# Patient Record
Sex: Female | Born: 1985 | Race: White | Hispanic: No | Marital: Single | Smoking: Never smoker
Health system: Southern US, Community
[De-identification: ages and names within clinical notes are randomized; demographics above are authoritative.]

## PROBLEM LIST (undated history)

## (undated) DIAGNOSIS — R51 Headache: Secondary | ICD-10-CM

## (undated) DIAGNOSIS — F419 Anxiety disorder, unspecified: Secondary | ICD-10-CM

## (undated) DIAGNOSIS — G4730 Sleep apnea, unspecified: Secondary | ICD-10-CM

## (undated) DIAGNOSIS — R519 Headache, unspecified: Secondary | ICD-10-CM

## (undated) DIAGNOSIS — F32A Depression, unspecified: Secondary | ICD-10-CM

## (undated) DIAGNOSIS — L0390 Cellulitis, unspecified: Secondary | ICD-10-CM

## (undated) DIAGNOSIS — E039 Hypothyroidism, unspecified: Secondary | ICD-10-CM

## (undated) HISTORY — PX: MYRINGOTOMY WITH TUBE PLACEMENT: SHX5663

## (undated) HISTORY — PX: UTERINE FIBROID SURGERY: SHX826

## (undated) HISTORY — PX: TONSILLECTOMY: SUR1361

## (undated) HISTORY — PX: OTHER SURGICAL HISTORY: SHX169

## (undated) HISTORY — DX: Headache: R51

## (undated) HISTORY — DX: Headache, unspecified: R51.9

---

## 2006-03-01 ENCOUNTER — Emergency Department (HOSPITAL_COMMUNITY): Admission: EM | Admit: 2006-03-01 | Discharge: 2006-03-01 | Payer: Self-pay | Admitting: Emergency Medicine

## 2009-06-07 ENCOUNTER — Other Ambulatory Visit: Admission: RE | Admit: 2009-06-07 | Discharge: 2009-06-07 | Payer: Self-pay | Admitting: Family Medicine

## 2010-07-29 ENCOUNTER — Other Ambulatory Visit
Admission: RE | Admit: 2010-07-29 | Discharge: 2010-07-29 | Payer: Self-pay | Source: Home / Self Care | Admitting: Family Medicine

## 2011-03-24 ENCOUNTER — Other Ambulatory Visit: Payer: Self-pay | Admitting: Family Medicine

## 2011-03-24 ENCOUNTER — Ambulatory Visit
Admission: RE | Admit: 2011-03-24 | Discharge: 2011-03-24 | Disposition: A | Discharge status: Home / Self Care | Payer: BC Managed Care – PPO | Source: Ambulatory Visit | Attending: Family Medicine | Admitting: Family Medicine

## 2011-03-24 DIAGNOSIS — M25572 Pain in left ankle and joints of left foot: Secondary | ICD-10-CM

## 2012-10-03 ENCOUNTER — Other Ambulatory Visit: Payer: Self-pay | Admitting: Family Medicine

## 2012-10-03 ENCOUNTER — Other Ambulatory Visit (HOSPITAL_COMMUNITY)
Admission: RE | Admit: 2012-10-03 | Discharge: 2012-10-03 | Disposition: A | Discharge status: Home / Self Care | Payer: BC Managed Care – PPO | Source: Ambulatory Visit | Attending: Family Medicine | Admitting: Family Medicine

## 2012-10-03 DIAGNOSIS — Z124 Encounter for screening for malignant neoplasm of cervix: Secondary | ICD-10-CM | POA: Insufficient documentation

## 2014-04-04 ENCOUNTER — Other Ambulatory Visit (HOSPITAL_COMMUNITY): Payer: Self-pay | Admitting: Family Medicine

## 2014-04-04 DIAGNOSIS — F502 Bulimia nervosa: Secondary | ICD-10-CM

## 2014-04-09 ENCOUNTER — Encounter (HOSPITAL_COMMUNITY)
Admission: RE | Admit: 2014-04-09 | Discharge: 2014-04-09 | Disposition: A | Discharge status: Home / Self Care | Payer: BC Managed Care – PPO | Source: Ambulatory Visit | Attending: Family Medicine | Admitting: Family Medicine

## 2014-04-09 ENCOUNTER — Observation Stay (HOSPITAL_COMMUNITY)
Admission: EM | Admit: 2014-04-09 | Discharge: 2014-04-12 | Disposition: A | Discharge status: Home / Self Care | Payer: BC Managed Care – PPO | Attending: General Surgery | Admitting: General Surgery

## 2014-04-09 ENCOUNTER — Encounter (HOSPITAL_COMMUNITY): Payer: Self-pay

## 2014-04-09 ENCOUNTER — Encounter (HOSPITAL_COMMUNITY): Payer: Self-pay | Admitting: Emergency Medicine

## 2014-04-09 DIAGNOSIS — K828 Other specified diseases of gallbladder: Secondary | ICD-10-CM | POA: Diagnosis present

## 2014-04-09 DIAGNOSIS — R1110 Vomiting, unspecified: Secondary | ICD-10-CM | POA: Diagnosis present

## 2014-04-09 DIAGNOSIS — D259 Leiomyoma of uterus, unspecified: Secondary | ICD-10-CM | POA: Diagnosis present

## 2014-04-09 DIAGNOSIS — Z6841 Body Mass Index (BMI) 40.0 and over, adult: Secondary | ICD-10-CM | POA: Diagnosis not present

## 2014-04-09 DIAGNOSIS — F502 Bulimia nervosa: Secondary | ICD-10-CM

## 2014-04-09 DIAGNOSIS — R109 Unspecified abdominal pain: Secondary | ICD-10-CM | POA: Diagnosis present

## 2014-04-09 DIAGNOSIS — K811 Chronic cholecystitis: Secondary | ICD-10-CM | POA: Diagnosis not present

## 2014-04-09 DIAGNOSIS — Z9049 Acquired absence of other specified parts of digestive tract: Secondary | ICD-10-CM

## 2014-04-09 LAB — COMPREHENSIVE METABOLIC PANEL
ALT: 156 U/L — ABNORMAL HIGH (ref 0–35)
AST: 55 U/L — ABNORMAL HIGH (ref 0–37)
Albumin: 3.8 g/dL (ref 3.5–5.2)
Alkaline Phosphatase: 62 U/L (ref 39–117)
Anion gap: 16 — ABNORMAL HIGH (ref 5–15)
BUN: 7 mg/dL (ref 6–23)
CO2: 26 mEq/L (ref 19–32)
Calcium: 9.7 mg/dL (ref 8.4–10.5)
Chloride: 96 mEq/L (ref 96–112)
Creatinine, Ser: 0.74 mg/dL (ref 0.50–1.10)
GFR calc Af Amer: >90 mL/min (ref >90)
GFR calc non Af Amer: >90 mL/min (ref >90)
Glucose, Bld: 89 mg/dL (ref 70–99)
Potassium: 3.3 mEq/L — ABNORMAL LOW (ref 3.7–5.3)
Sodium: 138 mEq/L (ref 137–147)
Total Bilirubin: 1 mg/dL (ref 0.3–1.2)
Total Protein: 8 g/dL (ref 6.0–8.3)

## 2014-04-09 LAB — URINE MICROSCOPIC-ADD ON

## 2014-04-09 LAB — CBC WITH DIFFERENTIAL/PLATELET
Basophils Absolute: 0 10*3/uL (ref 0.0–0.1)
Basophils Relative: 1 % (ref 0–1)
Eosinophils Absolute: 0.1 10*3/uL (ref 0.0–0.7)
Eosinophils Relative: 1 % (ref 0–5)
HCT: 41.9 % (ref 36.0–46.0)
Hemoglobin: 13.7 g/dL (ref 12.0–15.0)
Lymphocytes Relative: 31 % (ref 12–46)
Lymphs Abs: 2.3 10*3/uL (ref 0.7–4.0)
MCH: 28.2 pg (ref 26.0–34.0)
MCHC: 32.7 g/dL (ref 30.0–36.0)
MCV: 86.2 fL (ref 78.0–100.0)
Monocytes Absolute: 0.7 10*3/uL (ref 0.1–1.0)
Monocytes Relative: 10 % (ref 3–12)
Neutro Abs: 4.2 10*3/uL (ref 1.7–7.7)
Neutrophils Relative %: 57 % (ref 43–77)
Platelets: 233 10*3/uL (ref 150–400)
RBC: 4.86 MIL/uL (ref 3.87–5.11)
RDW: 13.6 % (ref 11.5–15.5)
WBC: 7.4 10*3/uL (ref 4.0–10.5)

## 2014-04-09 LAB — URINALYSIS, ROUTINE W REFLEX MICROSCOPIC
Glucose, UA: NEGATIVE mg/dL
Ketones, ur: 15 mg/dL — AB
Nitrite: POSITIVE — AB
Protein, ur: 30 mg/dL — AB
Specific Gravity, Urine: 1.027 (ref 1.005–1.030)
Urobilinogen, UA: 1 mg/dL (ref 0.0–1.0)
pH: 5.5 (ref 5.0–8.0)

## 2014-04-09 LAB — PREGNANCY, URINE: Preg Test, Ur: NEGATIVE

## 2014-04-09 LAB — LIPASE, BLOOD: Lipase: 24 U/L (ref 11–59)

## 2014-04-09 MED ORDER — TECHNETIUM TC 99M MEBROFENIN IV KIT
4.8000 | PACK | Freq: Once | INTRAVENOUS | Status: AC | PRN
  Administered 2014-04-09: 5 via INTRAVENOUS

## 2014-04-09 MED ORDER — SINCALIDE 5 MCG IJ SOLR: 0.0200 ug/kg | Freq: Once | INTRAMUSCULAR | Status: DC

## 2014-04-09 NOTE — ED Notes (Signed)
Pt reports RUQ pain with n/v.  Pt reports having a test done today, was found to have a non-functioning gallbladder.  Was told she needs surgery done but it's not scheduled yet.

## 2014-04-10 ENCOUNTER — Encounter (HOSPITAL_COMMUNITY): Payer: Self-pay | Admitting: General Surgery

## 2014-04-10 ENCOUNTER — Emergency Department (HOSPITAL_COMMUNITY): Payer: BC Managed Care – PPO

## 2014-04-10 DIAGNOSIS — R109 Unspecified abdominal pain: Secondary | ICD-10-CM | POA: Diagnosis present

## 2014-04-10 DIAGNOSIS — K828 Other specified diseases of gallbladder: Secondary | ICD-10-CM | POA: Diagnosis present

## 2014-04-10 DIAGNOSIS — D259 Leiomyoma of uterus, unspecified: Secondary | ICD-10-CM | POA: Diagnosis present

## 2014-04-10 DIAGNOSIS — K811 Chronic cholecystitis: Secondary | ICD-10-CM | POA: Diagnosis not present

## 2014-04-10 DIAGNOSIS — Z6841 Body Mass Index (BMI) 40.0 and over, adult: Secondary | ICD-10-CM | POA: Diagnosis not present

## 2014-04-10 LAB — COMPREHENSIVE METABOLIC PANEL
ALT: 135 U/L — ABNORMAL HIGH (ref 0–35)
AST: 48 U/L — ABNORMAL HIGH (ref 0–37)
Albumin: 3.5 g/dL (ref 3.5–5.2)
Alkaline Phosphatase: 57 U/L (ref 39–117)
Anion gap: 16 — ABNORMAL HIGH (ref 5–15)
BUN: 7 mg/dL (ref 6–23)
CO2: 25 mEq/L (ref 19–32)
Calcium: 9.1 mg/dL (ref 8.4–10.5)
Chloride: 102 mEq/L (ref 96–112)
Creatinine, Ser: 0.67 mg/dL (ref 0.50–1.10)
GFR calc Af Amer: >90 mL/min (ref >90)
GFR calc non Af Amer: >90 mL/min (ref >90)
Glucose, Bld: 85 mg/dL (ref 70–99)
Potassium: 3.7 mEq/L (ref 3.7–5.3)
Sodium: 143 mEq/L (ref 137–147)
Total Bilirubin: 1 mg/dL (ref 0.3–1.2)
Total Protein: 7.5 g/dL (ref 6.0–8.3)

## 2014-04-10 LAB — URINALYSIS, ROUTINE W REFLEX MICROSCOPIC
Glucose, UA: NEGATIVE mg/dL
Ketones, ur: 15 mg/dL — AB
Nitrite: POSITIVE — AB
Protein, ur: NEGATIVE mg/dL
Specific Gravity, Urine: 1.027 (ref 1.005–1.030)
Urobilinogen, UA: 1 mg/dL (ref 0.0–1.0)
pH: 5.5 (ref 5.0–8.0)

## 2014-04-10 LAB — CBC WITH DIFFERENTIAL/PLATELET
Basophils Absolute: 0 10*3/uL (ref 0.0–0.1)
Basophils Relative: 0 % (ref 0–1)
Eosinophils Absolute: 0.2 10*3/uL (ref 0.0–0.7)
Eosinophils Relative: 3 % (ref 0–5)
HCT: 42.1 % (ref 36.0–46.0)
Hemoglobin: 14.4 g/dL (ref 12.0–15.0)
Lymphocytes Relative: 38 % (ref 12–46)
Lymphs Abs: 2.1 10*3/uL (ref 0.7–4.0)
MCH: 30.3 pg (ref 26.0–34.0)
MCHC: 34.2 g/dL (ref 30.0–36.0)
MCV: 88.4 fL (ref 78.0–100.0)
Monocytes Absolute: 0.6 10*3/uL (ref 0.1–1.0)
Monocytes Relative: 11 % (ref 3–12)
Neutro Abs: 2.6 10*3/uL (ref 1.7–7.7)
Neutrophils Relative %: 48 % (ref 43–77)
Platelets: 184 10*3/uL (ref 150–400)
RBC: 4.76 MIL/uL (ref 3.87–5.11)
RDW: 13.6 % (ref 11.5–15.5)
WBC: 5.5 10*3/uL (ref 4.0–10.5)

## 2014-04-10 LAB — URINE MICROSCOPIC-ADD ON

## 2014-04-10 LAB — LIPASE, BLOOD: Lipase: 24 U/L (ref 11–59)

## 2014-04-10 LAB — SURGICAL PCR SCREEN
MRSA, PCR: NEGATIVE
Staphylococcus aureus: POSITIVE — AB

## 2014-04-10 MED ORDER — DEXTROSE 5 % IV SOLN
1.0000 g | Freq: Once | INTRAVENOUS | Status: AC
  Administered 2014-04-10: 1 g via INTRAVENOUS
  Filled 2014-04-10: qty 10

## 2014-04-10 MED ORDER — DIPHENHYDRAMINE HCL 12.5 MG/5ML PO ELIX: 12.5000 mg | ORAL_SOLUTION | Freq: Four times a day (QID) | ORAL | Status: DC | PRN

## 2014-04-10 MED ORDER — ONDANSETRON HCL 4 MG/2ML IJ SOLN
4.0000 mg | Freq: Once | INTRAMUSCULAR | Status: AC
  Administered 2014-04-10: 4 mg via INTRAVENOUS
  Filled 2014-04-10: qty 2

## 2014-04-10 MED ORDER — PANTOPRAZOLE SODIUM 40 MG PO TBEC
40.0000 mg | DELAYED_RELEASE_TABLET | Freq: Every day | ORAL | Status: DC
  Administered 2014-04-10 – 2014-04-12 (×2): 40 mg via ORAL
  Filled 2014-04-10 (×3): qty 1

## 2014-04-10 MED ORDER — DIPHENHYDRAMINE HCL 50 MG/ML IJ SOLN
12.5000 mg | Freq: Four times a day (QID) | INTRAMUSCULAR | Status: DC | PRN
  Administered 2014-04-12: 12.5 mg via INTRAVENOUS
  Filled 2014-04-10: qty 1

## 2014-04-10 MED ORDER — ACETAMINOPHEN 325 MG PO TABS: 650.0000 mg | ORAL_TABLET | Freq: Four times a day (QID) | ORAL | Status: DC | PRN

## 2014-04-10 MED ORDER — ACETAMINOPHEN 650 MG RE SUPP: 650.0000 mg | Freq: Four times a day (QID) | RECTAL | Status: DC | PRN

## 2014-04-10 MED ORDER — HYDROMORPHONE HCL PF 1 MG/ML IJ SOLN
1.0000 mg | Freq: Once | INTRAMUSCULAR | Status: AC
  Administered 2014-04-10: 1 mg via INTRAVENOUS
  Filled 2014-04-10: qty 1

## 2014-04-10 MED ORDER — CEFAZOLIN SODIUM-DEXTROSE 2-3 GM-% IV SOLR
2.0000 g | Freq: Three times a day (TID) | INTRAVENOUS | Status: DC
  Administered 2014-04-10 – 2014-04-12 (×6): 2 g via INTRAVENOUS
  Filled 2014-04-10 (×7): qty 50

## 2014-04-10 MED ORDER — ONDANSETRON HCL 4 MG/2ML IJ SOLN: 4.0000 mg | Freq: Four times a day (QID) | INTRAMUSCULAR | Status: DC | PRN

## 2014-04-10 MED ORDER — SODIUM CHLORIDE 0.9 % IV SOLN
Freq: Once | INTRAVENOUS | Status: AC
  Administered 2014-04-10: 05:00:00 via INTRAVENOUS

## 2014-04-10 MED ORDER — KCL IN DEXTROSE-NACL 20-5-0.45 MEQ/L-%-% IV SOLN
INTRAVENOUS | Status: DC
  Administered 2014-04-10: 22:00:00 via INTRAVENOUS
  Administered 2014-04-10: 1000 mL via INTRAVENOUS
  Administered 2014-04-11: 06:00:00 via INTRAVENOUS
  Filled 2014-04-10 (×4): qty 1000

## 2014-04-10 MED ORDER — SODIUM CHLORIDE 0.9 % IV BOLUS (SEPSIS)
1000.0000 mL | Freq: Once | INTRAVENOUS | Status: AC
  Administered 2014-04-10: 1000 mL via INTRAVENOUS

## 2014-04-10 MED ORDER — MORPHINE SULFATE 2 MG/ML IJ SOLN
1.0000 mg | INTRAMUSCULAR | Status: DC | PRN
  Administered 2014-04-10 (×2): 2 mg via INTRAVENOUS
  Filled 2014-04-10 (×2): qty 1

## 2014-04-10 NOTE — H&P (Signed)
Monique Duncan [birth year]  [id number] Chief Complaint/Reason for Consult: abdominal pain HPI: This is a 28 yo morbidly obese white female with a h/o abdominal pain felt to be from her uterine fibroids.  Usually this pain is lower in the abdomen, but on her right side.  About 3 weeks ago she began having RUQ abdominal pain with nausea and vomiting.  She would vomit after everything she ate.  She was able to tolerate some liquids, but nothing solid.  She was seen in the [place] ED 2 different times about 2 weeks ago.  She had a CT of her chest as well as her abdomen.  These were normal except findings of two uterine leiomyomas.  She was also diagnosed with a UTI and sent out on Keflex.  She followed up with PCP.  She was found to have some abnormal elevation of her AST and ALT.  She was later sent to a GI doctor who ordered a HIDA scan yesterday.  This revealed a gallbladder ejection fraction of 11%.  Her pain continued to remain so bad that she presented to [facility] last night for further evaluation.  She did have an abdominal ultrasound done that revealed sludge but no stones.  No evidence of cholecystitis.  Her CBD was slightly enlarged, but no definite evidence of choledocholithiasis.  Her AST and ALT were 56 and 156 yesterday with a normal WBC and normal neutrophil count.  She continues to have a UTI.  She denies any difficultly with voiding.  At times she does admit to some SOB and mild chest discomfort.  We have been asked to see the patient for further evaluation.  ROS: Please see HPI, otherwise all other systems have been reviewed and are negative  No family history on file.  History reviewed. No pertinent past medical history.  Past Surgical History  Procedure Laterality Date  . Tonsillectomy    myringotomy with tube placement  Social History:  reports that she has never smoked. She does not have any smokeless tobacco history on file. She reports that she does not drink alcohol or use illicit  drugs.  Allergies:  Allergies  Allergen Reactions  . Bee Venom Swelling     (Not in a hospital admission)  Blood pressure 134/86, pulse 71, temperature 98.2 F (36.8 C), temperature source Oral, resp. rate 16, height $RemoveBe'5\' 2"'EwfiAyvKk$  (1.575 m), weight 296 lb (134.265 kg), last menstrual period 04/05/2014, SpO2 97.00%. Physical Exam: General: pleasant, morbidly obese white female who is laying in bed in NAD HEENT: head is normocephalic, atraumatic.  Sclera are noninjected.  PERRL.  Ears and nose without any masses or lesions.  Mouth is pink and moist Heart: regular, rate, and rhythm.  Normal s1,s2. No obvious murmurs, gallops, or rubs noted.  Palpable radial and pedal pulses bilaterally Lungs: CTAB, no wheezes, rhonchi, or rales noted.  Respiratory effort nonlabored Abd: soft, very tender in RUQ and some tenderness in the RLQ, ND, +BS, no masses, hernias, or organomegaly MS: all 4 extremities are symmetrical with no cyanosis, clubbing, or edema. Skin: warm and dry with no masses, lesions, or rashes Psych: A&Ox3 with an appropriate affect.    Results for orders placed during the hospital encounter of 04/09/14 (from the past 48 hour(s))  PREGNANCY, URINE     Status: None   Collection Time    04/09/14  8:28 PM      Result Value Ref Range   Preg Test, Ur NEGATIVE  NEGATIVE   Comment:  THE SENSITIVITY OF THIS     METHODOLOGY IS >20 mIU/mL.  URINALYSIS, ROUTINE W REFLEX MICROSCOPIC     Status: Abnormal   Collection Time    04/09/14  8:28 PM      Result Value Ref Range   Color, Urine ORANGE (*) YELLOW   Comment: BIOCHEMICALS MAY BE AFFECTED BY COLOR   APPearance TURBID (*) CLEAR   Specific Gravity, Urine 1.027  1.005 - 1.030   pH 5.5  5.0 - 8.0   Glucose, UA NEGATIVE  NEGATIVE mg/dL   Hgb urine dipstick LARGE (*) NEGATIVE   Bilirubin Urine MODERATE (*) NEGATIVE   Ketones, ur 15 (*) NEGATIVE mg/dL   Protein, ur 30 (*) NEGATIVE mg/dL   Urobilinogen, UA 1.0  0.0 - 1.0 mg/dL    Nitrite POSITIVE (*) NEGATIVE   Leukocytes, UA SMALL (*) NEGATIVE  URINE MICROSCOPIC-ADD ON     Status: Abnormal   Collection Time    04/09/14  8:28 PM      Result Value Ref Range   Squamous Epithelial / LPF FEW (*) RARE   WBC, UA 3-6  <3 WBC/hpf   RBC / HPF 7-10  <3 RBC/hpf   Bacteria, UA MANY (*) RARE   Urine-Other AMORPHOUS URATES/PHOSPHATES    CBC WITH DIFFERENTIAL     Status: None   Collection Time    04/09/14  9:03 PM      Result Value Ref Range   WBC 7.4  4.0 - 10.5 K/uL   RBC 4.86  3.87 - 5.11 MIL/uL   Hemoglobin 13.7  12.0 - 15.0 g/dL   HCT 41.9  36.0 - 46.0 %   MCV 86.2  78.0 - 100.0 fL   MCH 28.2  26.0 - 34.0 pg   MCHC 32.7  30.0 - 36.0 g/dL   RDW 13.6  11.5 - 15.5 %   Platelets 233  150 - 400 K/uL   Neutrophils Relative % 57  43 - 77 %   Neutro Abs 4.2  1.7 - 7.7 K/uL   Lymphocytes Relative 31  12 - 46 %   Lymphs Abs 2.3  0.7 - 4.0 K/uL   Monocytes Relative 10  3 - 12 %   Monocytes Absolute 0.7  0.1 - 1.0 K/uL   Eosinophils Relative 1  0 - 5 %   Eosinophils Absolute 0.1  0.0 - 0.7 K/uL   Basophils Relative 1  0 - 1 %   Basophils Absolute 0.0  0.0 - 0.1 K/uL  COMPREHENSIVE METABOLIC PANEL     Status: Abnormal   Collection Time    04/09/14  9:03 PM      Result Value Ref Range   Sodium 138  137 - 147 mEq/L   Potassium 3.3 (*) 3.7 - 5.3 mEq/L   Chloride 96  96 - 112 mEq/L   CO2 26  19 - 32 mEq/L   Glucose, Bld 89  70 - 99 mg/dL   BUN 7  6 - 23 mg/dL   Creatinine, Ser 0.74  0.50 - 1.10 mg/dL   Calcium 9.7  8.4 - 10.5 mg/dL   Total Protein 8.0  6.0 - 8.3 g/dL   Albumin 3.8  3.5 - 5.2 g/dL   AST 55 (*) 0 - 37 U/L   ALT 156 (*) 0 - 35 U/L   Alkaline Phosphatase 62  39 - 117 U/L   Total Bilirubin 1.0  0.3 - 1.2 mg/dL   GFR calc non Af Amer >90  >90  mL/min   GFR calc Af Amer >90  >90 mL/min   Comment: (NOTE)     The eGFR has been calculated using the CKD EPI equation.     This calculation has not been validated in all clinical situations.     eGFR's  persistently <90 mL/min signify possible Chronic Kidney     Disease.   Anion gap 16 (*) 5 - 15  LIPASE, BLOOD     Status: None   Collection Time    04/09/14  9:03 PM      Result Value Ref Range   Lipase 24  11 - 59 U/L   Nm Hepato W/eject Fract  04/09/2014   CLINICAL DATA:  Abdominal pain, vomiting  EXAM: NUCLEAR MEDICINE HEPATOBILIARY IMAGING WITH GALLBLADDER EF  TECHNIQUE: Sequential images of the abdomen were obtained out to 60 minutes following intravenous administration of radiopharmaceutical. After slow intravenous infusion of 2.0 micrograms Cholecystokinin, gallbladder ejection fraction was determined.  RADIOPHARMACEUTICALS:  5.0 Millicurie EL-38B Choletec  COMPARISON:  None  FINDINGS: Normal uptake of radiotracer in the liver. Excreted bile is evident within the small bowel by 20 min. The gallbladder begins to fill at 65 min. Slow infusion of cholecystokinin resulted in minimal contraction of the gallbladder with an estimated ejection fraction = 11%. At 30 min, normal ejection fraction is greater than 30%.  IMPRESSION: 1. Low gallbladder ejection fraction = 11%. Common differential diagnosis includes biliary dyskinesia, chronic cholecystitis, and sphincter Oddi dysfunction. 2. Patent cystic duct and common bile duct.   Electronically Signed   By: Suzy Bouchard M.D.   On: 04/09/2014 11:18   US Abdomen Limited Ruq  04/10/2014   CLINICAL DATA:  RIGHT upper quadrant pain.  EXAM: US ABDOMEN LIMITED - RIGHT UPPER QUADRANT  COMPARISON:  HIDA scan April 09, 2014  FINDINGS: Gallbladder:  Layering echogenic gallbladder sludge. No gallbladder wall thickening or pericholecystic fluid. No sonographic Murphy's sign was elicited.  Common bile duct:  Diameter: 7 mm, enlarged without sonographic findings of choledocholithiasis.  Liver:  No focal lesion identified. Within normal limits in parenchymal echogenicity. Hepatopetal portal vein.  IMPRESSION: Layering gallbladder sludge, without sonographic  findings of acute cholecystitis.  Prominent Common bile duct could reflect recently passed sludge/ stone without sonographic findings of choledocholithiasis.   Electronically Signed   By: Elon Alas   On: 04/10/2014 04:11       Assessment/Plan 1. Abdominal pain 2. Biliary dyskinesia 3. Uterine fibroids 4. Morbid obesity 5. Suspect UTI  Plan: 1. We will get the patient admitted and plan for cholecystectomy this admission.  We will repeat labs today as well as in the am.  She will be started on IVFs as well as ancef.  I have resent her UA and added a culture to this.  abx coverage can be changed when these results return if needed. 2. Remain NPO today in case we can proceed with surgical intervention, but will likely be tomorrow.  I discussed this with the patient as well as her mother.  They understand.    Tim Wilhide E 04/10/2014, 7:55 AM Pager: [phone]

## 2014-04-10 NOTE — ED Notes (Signed)
MD at bedside. 

## 2014-04-10 NOTE — H&P (Signed)
I have seen and examined the pt and agree with PA-Osborne's progress note. Pt with likely biliary dyskenesia vs, cholecystitis Will plan for Lap chole All risks and benefits were discussed with the patient to generally include: infection, bleeding, possible need for post op ERCP, damage to the bile ducts, and bile leak. Alternatives were offered and described.  All questions were answered and the patient voiced understanding of the procedure and wishes to proceed at this point with a laparoscopic cholecystectomy

## 2014-04-10 NOTE — ED Notes (Signed)
US at bedside

## 2014-04-10 NOTE — ED Notes (Signed)
[place] finished and left the room

## 2014-04-10 NOTE — ED Provider Notes (Signed)
CSN: [id number]     Arrival date & time 04/09/14  1911 History   First MD Initiated Contact with Patient 04/09/14 2356     Chief Complaint  Patient presents with  . Abdominal Pain     (Consider location/radiation/quality/duration/timing/severity/associated sxs/prior Treatment) HPI Comments: Pt comes in with cc of abd pain. Abd pain is located in the RUQ and right flank region. Pain x 3 weeks, constant, with waxing and waning intensity. The pain today has been severe, with nausea and emesis. No UTI like sx and denies anorexia. No hx of renal stones and there is no vaginal discharge or bleeding. Pt had RUQ [place] - which was neg, and had HIDA scan that did show biliary dyskinesia. Pt has no f/u at this time. Pain is unbearable, so she comes to the ER. No pain meds taken.   Patient is a 28 y.o. female presenting with abdominal pain. The history is provided by the patient.  Abdominal Pain Associated symptoms: nausea and vomiting   Associated symptoms: no chest pain, no chills, no constipation, no dysuria, no fever and no shortness of breath     History reviewed. No pertinent past medical history. Past Surgical History  Procedure Laterality Date  . Tonsillectomy     No family history on file. History  Substance Use Topics  . Smoking status: Never Smoker   . Smokeless tobacco: Not on file  . Alcohol Use: No   OB History   Grav Para Term Preterm Abortions TAB SAB Ect Mult Living                 Review of Systems  Constitutional: Positive for activity change. Negative for fever and chills.  Respiratory: Negative for shortness of breath.   Cardiovascular: Negative for chest pain.  Gastrointestinal: Positive for nausea, vomiting and abdominal pain. Negative for constipation.  Endocrine: Negative for polyuria.  Genitourinary: Positive for flank pain. Negative for dysuria.  Musculoskeletal: Negative for neck pain.  Skin: Negative for rash.  Allergic/Immunologic: Negative for  immunocompromised state.  Neurological: Negative for headaches.      Allergies  Bee venom  Home Medications   Prior to Admission medications   Medication Sig Start Date End Date Taking? Authorizing Provider  Norgestimate-Ethinyl Estradiol Triphasic (TRI-SPRINTEC) 0.18/0.215/0.25 MG-35 MCG tablet Take 1 tablet by mouth daily.   Yes Historical Provider, MD   BP 132/61  Pulse 58  Temp(Src) 98.4 F (36.9 C) (Oral)  Resp 16  Ht 5\' 2"  (1.575 m)  Wt 296 lb (134.265 kg)  BMI 54.13 kg/m2  SpO2 100%  LMP 04/05/2014 Physical Exam  Nursing note and vitals reviewed. Constitutional: She is oriented to person, place, and time. She appears well-developed and well-nourished.  HENT:  Head: Normocephalic and atraumatic.  Eyes: EOM are normal. Pupils are equal, round, and reactive to light.  Neck: Neck supple.  Cardiovascular: Normal rate, regular rhythm and normal heart sounds.   No murmur heard. Pulmonary/Chest: Effort normal. No respiratory distress.  Abdominal: Soft. She exhibits no distension. There is tenderness. There is guarding. There is no rebound.  RUQ tenderness, with Murphy's  Neurological: She is alert and oriented to person, place, and time.  Skin: Skin is warm and dry.    ED Course  Procedures (including critical care time) Labs Review Labs Reviewed  COMPREHENSIVE METABOLIC PANEL - Abnormal; Notable for the following:    Potassium 3.3 (*)    AST 55 (*)    ALT 156 (*)    Anion gap 16 (*)  All other components within normal limits  URINALYSIS, ROUTINE W REFLEX MICROSCOPIC - Abnormal; Notable for the following:    Color, Urine ORANGE (*)    APPearance TURBID (*)    Hgb urine dipstick LARGE (*)    Bilirubin Urine MODERATE (*)    Ketones, ur 15 (*)    Protein, ur 30 (*)    Nitrite POSITIVE (*)    Leukocytes, UA SMALL (*)    All other components within normal limits  URINE MICROSCOPIC-ADD ON - Abnormal; Notable for the following:    Squamous Epithelial / LPF FEW  (*)    Bacteria, UA MANY (*)    All other components within normal limits  URINE CULTURE  CBC WITH DIFFERENTIAL  LIPASE, BLOOD  PREGNANCY, URINE  CBC WITH DIFFERENTIAL  COMPREHENSIVE METABOLIC PANEL  LIPASE, BLOOD  URINALYSIS, ROUTINE W REFLEX MICROSCOPIC    Imaging Review Nm Hepato W/eject Fract  04/09/2014   CLINICAL DATA:  Abdominal pain, vomiting  EXAM: NUCLEAR MEDICINE HEPATOBILIARY IMAGING WITH GALLBLADDER EF  TECHNIQUE: Sequential images of the abdomen were obtained out to 60 minutes following intravenous administration of radiopharmaceutical. After slow intravenous infusion of 2.0 micrograms Cholecystokinin, gallbladder ejection fraction was determined.  RADIOPHARMACEUTICALS:  5.0 Millicurie TK-35W Choletec  COMPARISON:  None  FINDINGS: Normal uptake of radiotracer in the liver. Excreted bile is evident within the small bowel by 20 min. The gallbladder begins to fill at 65 min. Slow infusion of cholecystokinin resulted in minimal contraction of the gallbladder with an estimated ejection fraction = 11%. At 30 min, normal ejection fraction is greater than 30%.  IMPRESSION: 1. Low gallbladder ejection fraction = 11%. Common differential diagnosis includes biliary dyskinesia, chronic cholecystitis, and sphincter Oddi dysfunction. 2. Patent cystic duct and common bile duct.   Electronically Signed   By: Suzy Bouchard M.D.   On: 04/09/2014 11:18   US Abdomen Limited Ruq  04/10/2014   CLINICAL DATA:  RIGHT upper quadrant pain.  EXAM: US ABDOMEN LIMITED - RIGHT UPPER QUADRANT  COMPARISON:  HIDA scan April 09, 2014  FINDINGS: Gallbladder:  Layering echogenic gallbladder sludge. No gallbladder wall thickening or pericholecystic fluid. No sonographic Murphy's sign was elicited.  Common bile duct:  Diameter: 7 mm, enlarged without sonographic findings of choledocholithiasis.  Liver:  No focal lesion identified. Within normal limits in parenchymal echogenicity. Hepatopetal portal vein.   IMPRESSION: Layering gallbladder sludge, without sonographic findings of acute cholecystitis.  Prominent Common bile duct could reflect recently passed sludge/ stone without sonographic findings of choledocholithiasis.   Electronically Signed   By: Elon Alas   On: 04/10/2014 04:11     EKG Interpretation None      MDM   Final diagnoses:  None    DDx includes: Pancreatitis Hepatobiliary pathology including cholecystitis Gastritis/PUD SBO ACS syndrome Aortic Dissection  PT comes in with RUQ abd pain. Neg [place] at [place] 3 weeks ago. Clinical concerns for cholelithiasis. Pt had HIDA scan yday, and it shows biliary dyskinesia. Spoke with surgery on call about this patient, and they want an [place]. They will see the patient in the AM, and likely admit.    Varney Biles, MD 04/10/14 [phone]

## 2014-04-11 ENCOUNTER — Observation Stay (HOSPITAL_COMMUNITY): Payer: BC Managed Care – PPO | Admitting: Anesthesiology

## 2014-04-11 ENCOUNTER — Encounter (HOSPITAL_COMMUNITY)
Admission: EM | Disposition: A | Discharge status: Home / Self Care | Payer: Self-pay | Source: Home / Self Care | Attending: Emergency Medicine

## 2014-04-11 ENCOUNTER — Ambulatory Visit (HOSPITAL_COMMUNITY): Payer: BC Managed Care – PPO | Admitting: Anesthesiology

## 2014-04-11 HISTORY — PX: CHOLECYSTECTOMY: SHX55

## 2014-04-11 LAB — URINE CULTURE
Colony Count: NO GROWTH
Culture: NO GROWTH
Special Requests: NORMAL

## 2014-04-11 LAB — CBC
HCT: 38.5 % (ref 36.0–46.0)
Hemoglobin: 12.6 g/dL (ref 12.0–15.0)
MCH: 28.4 pg (ref 26.0–34.0)
MCHC: 32.7 g/dL (ref 30.0–36.0)
MCV: 86.9 fL (ref 78.0–100.0)
Platelets: 173 10*3/uL (ref 150–400)
RBC: 4.43 MIL/uL (ref 3.87–5.11)
RDW: 13.5 % (ref 11.5–15.5)
WBC: 5.9 10*3/uL (ref 4.0–10.5)

## 2014-04-11 LAB — COMPREHENSIVE METABOLIC PANEL
ALT: 92 U/L — ABNORMAL HIGH (ref 0–35)
AST: 29 U/L (ref 0–37)
Albumin: 2.9 g/dL — ABNORMAL LOW (ref 3.5–5.2)
Alkaline Phosphatase: 50 U/L (ref 39–117)
Anion gap: 16 — ABNORMAL HIGH (ref 5–15)
BUN: 4 mg/dL — ABNORMAL LOW (ref 6–23)
CO2: 22 mEq/L (ref 19–32)
Calcium: 8.6 mg/dL (ref 8.4–10.5)
Chloride: 101 mEq/L (ref 96–112)
Creatinine, Ser: 0.63 mg/dL (ref 0.50–1.10)
GFR calc Af Amer: >90 mL/min (ref >90)
GFR calc non Af Amer: >90 mL/min (ref >90)
Glucose, Bld: 102 mg/dL — ABNORMAL HIGH (ref 70–99)
Potassium: 3.8 mEq/L (ref 3.7–5.3)
Sodium: 139 mEq/L (ref 137–147)
Total Bilirubin: 0.9 mg/dL (ref 0.3–1.2)
Total Protein: 6.4 g/dL (ref 6.0–8.3)

## 2014-04-11 SURGERY — LAPAROSCOPIC CHOLECYSTECTOMY
Anesthesia: General | Site: Abdomen

## 2014-04-11 MED ORDER — LACTATED RINGERS IR SOLN
Status: DC | PRN
  Administered 2014-04-11: 1000 mL

## 2014-04-11 MED ORDER — FENTANYL CITRATE 0.05 MG/ML IJ SOLN
INTRAMUSCULAR | Status: AC
  Filled 2014-04-11: qty 2

## 2014-04-11 MED ORDER — LACTATED RINGERS IV SOLN: INTRAVENOUS | Status: DC

## 2014-04-11 MED ORDER — SUCCINYLCHOLINE CHLORIDE 20 MG/ML IJ SOLN
INTRAMUSCULAR | Status: DC | PRN
  Administered 2014-04-11: 140 mg via INTRAVENOUS

## 2014-04-11 MED ORDER — GLYCOPYRROLATE 0.2 MG/ML IJ SOLN
INTRAMUSCULAR | Status: AC
  Filled 2014-04-11: qty 1

## 2014-04-11 MED ORDER — 0.9 % SODIUM CHLORIDE (POUR BTL) OPTIME
TOPICAL | Status: DC | PRN
  Administered 2014-04-11: 1000 mL

## 2014-04-11 MED ORDER — BUPIVACAINE-EPINEPHRINE (PF) 0.25% -1:200000 IJ SOLN
INTRAMUSCULAR | Status: AC
  Filled 2014-04-11: qty 30

## 2014-04-11 MED ORDER — DEXAMETHASONE SODIUM PHOSPHATE 10 MG/ML IJ SOLN
INTRAMUSCULAR | Status: DC | PRN
  Administered 2014-04-11: 10 mg via INTRAVENOUS

## 2014-04-11 MED ORDER — HYDROMORPHONE HCL PF 1 MG/ML IJ SOLN
INTRAMUSCULAR | Status: AC
  Filled 2014-04-11: qty 1

## 2014-04-11 MED ORDER — ATROPINE SULFATE 0.4 MG/ML IJ SOLN
INTRAMUSCULAR | Status: AC
  Filled 2014-04-11: qty 1

## 2014-04-11 MED ORDER — ALUM & MAG HYDROXIDE-SIMETH 200-200-20 MG/5ML PO SUSP
30.0000 mL | Freq: Once | ORAL | Status: DC
  Filled 2014-04-11: qty 30

## 2014-04-11 MED ORDER — PROPOFOL 10 MG/ML IV BOLUS
INTRAVENOUS | Status: DC | PRN
  Administered 2014-04-11: 200 mg via INTRAVENOUS

## 2014-04-11 MED ORDER — ONDANSETRON HCL 4 MG/2ML IJ SOLN
INTRAMUSCULAR | Status: AC
  Filled 2014-04-11: qty 2

## 2014-04-11 MED ORDER — CEFAZOLIN SODIUM 1-5 GM-% IV SOLN
1.0000 g | Freq: Once | INTRAVENOUS | Status: AC
  Administered 2014-04-11: 1 g via INTRAVENOUS
  Filled 2014-04-11: qty 50

## 2014-04-11 MED ORDER — ROCURONIUM BROMIDE 100 MG/10ML IV SOLN
INTRAVENOUS | Status: AC
  Filled 2014-04-11: qty 1

## 2014-04-11 MED ORDER — FENTANYL CITRATE 0.05 MG/ML IJ SOLN
INTRAMUSCULAR | Status: DC | PRN
  Administered 2014-04-11 (×2): 100 ug via INTRAVENOUS
  Administered 2014-04-11: 50 ug via INTRAVENOUS

## 2014-04-11 MED ORDER — BUPIVACAINE-EPINEPHRINE (PF) 0.25% -1:200000 IJ SOLN
INTRAMUSCULAR | Status: DC | PRN
Start: 2014-04-11 — End: 2014-04-11
  Administered 2014-04-11: 15 mL

## 2014-04-11 MED ORDER — CHLORHEXIDINE GLUCONATE CLOTH 2 % EX PADS
6.0000 | MEDICATED_PAD | Freq: Every day | CUTANEOUS | Status: DC
  Administered 2014-04-11: 6 via TOPICAL

## 2014-04-11 MED ORDER — ROCURONIUM BROMIDE 100 MG/10ML IV SOLN
INTRAVENOUS | Status: DC | PRN
  Administered 2014-04-11: 40 mg via INTRAVENOUS

## 2014-04-11 MED ORDER — SODIUM CHLORIDE 0.9 % IJ SOLN
INTRAMUSCULAR | Status: AC
  Filled 2014-04-11: qty 10

## 2014-04-11 MED ORDER — FENTANYL CITRATE 0.05 MG/ML IJ SOLN
INTRAMUSCULAR | Status: AC
  Filled 2014-04-11: qty 5

## 2014-04-11 MED ORDER — GLYCOPYRROLATE 0.2 MG/ML IJ SOLN
INTRAMUSCULAR | Status: DC | PRN
  Administered 2014-04-11: .8 mg via INTRAVENOUS

## 2014-04-11 MED ORDER — ONDANSETRON HCL 4 MG PO TABS: 4.0000 mg | ORAL_TABLET | Freq: Four times a day (QID) | ORAL | Status: DC | PRN

## 2014-04-11 MED ORDER — HYDROMORPHONE HCL 2 MG/ML IJ SOLN
2.0000 mg | INTRAMUSCULAR | Status: DC | PRN
  Administered 2014-04-11 – 2014-04-12 (×4): 2 mg via INTRAVENOUS
  Filled 2014-04-11 (×4): qty 1

## 2014-04-11 MED ORDER — PROMETHAZINE HCL 25 MG/ML IJ SOLN
6.2500 mg | INTRAMUSCULAR | Status: DC | PRN
  Administered 2014-04-11: 6.25 mg via INTRAVENOUS

## 2014-04-11 MED ORDER — HYDROMORPHONE HCL PF 1 MG/ML IJ SOLN
0.2500 mg | INTRAMUSCULAR | Status: DC | PRN
  Administered 2014-04-11 (×4): 0.5 mg via INTRAVENOUS

## 2014-04-11 MED ORDER — MIDAZOLAM HCL 2 MG/2ML IJ SOLN
INTRAMUSCULAR | Status: AC
  Filled 2014-04-11: qty 2

## 2014-04-11 MED ORDER — EPHEDRINE SULFATE 50 MG/ML IJ SOLN
INTRAMUSCULAR | Status: AC
  Filled 2014-04-11: qty 1

## 2014-04-11 MED ORDER — OXYCODONE-ACETAMINOPHEN 5-325 MG PO TABS
1.0000 | ORAL_TABLET | ORAL | Status: DC | PRN
  Administered 2014-04-12: 2 via ORAL
  Filled 2014-04-11 (×2): qty 2

## 2014-04-11 MED ORDER — KETOROLAC TROMETHAMINE 30 MG/ML IJ SOLN
INTRAMUSCULAR | Status: AC
  Filled 2014-04-11: qty 1

## 2014-04-11 MED ORDER — MIDAZOLAM HCL 5 MG/5ML IJ SOLN
INTRAMUSCULAR | Status: DC | PRN
  Administered 2014-04-11: 2 mg via INTRAVENOUS

## 2014-04-11 MED ORDER — LIDOCAINE HCL (CARDIAC) 20 MG/ML IV SOLN
INTRAVENOUS | Status: AC
  Filled 2014-04-11: qty 5

## 2014-04-11 MED ORDER — DEXAMETHASONE SODIUM PHOSPHATE 10 MG/ML IJ SOLN
INTRAMUSCULAR | Status: AC
  Filled 2014-04-11: qty 1

## 2014-04-11 MED ORDER — DEXTROSE-NACL 5-0.9 % IV SOLN
INTRAVENOUS | Status: DC
  Administered 2014-04-11 – 2014-04-12 (×2): via INTRAVENOUS

## 2014-04-11 MED ORDER — KETOROLAC TROMETHAMINE 30 MG/ML IJ SOLN
15.0000 mg | Freq: Once | INTRAMUSCULAR | Status: AC | PRN
  Administered 2014-04-11: 30 mg via INTRAVENOUS
  Filled 2014-04-11: qty 1

## 2014-04-11 MED ORDER — ONDANSETRON HCL 4 MG/2ML IJ SOLN
INTRAMUSCULAR | Status: DC | PRN
  Administered 2014-04-11: 4 mg via INTRAVENOUS

## 2014-04-11 MED ORDER — FENTANYL CITRATE 0.05 MG/ML IJ SOLN
25.0000 ug | INTRAMUSCULAR | Status: DC | PRN
  Administered 2014-04-11 (×2): 25 ug via INTRAVENOUS
  Administered 2014-04-11: 50 ug via INTRAVENOUS

## 2014-04-11 MED ORDER — GLYCOPYRROLATE 0.2 MG/ML IJ SOLN
INTRAMUSCULAR | Status: AC
  Filled 2014-04-11: qty 3

## 2014-04-11 MED ORDER — LIDOCAINE HCL (PF) 2 % IJ SOLN
INTRAMUSCULAR | Status: DC | PRN
  Administered 2014-04-11: 75 mg via INTRADERMAL

## 2014-04-11 MED ORDER — ACETAMINOPHEN 10 MG/ML IV SOLN
1000.0000 mg | Freq: Once | INTRAVENOUS | Status: AC
  Administered 2014-04-11: 1000 mg via INTRAVENOUS
  Filled 2014-04-11: qty 100

## 2014-04-11 MED ORDER — MUPIROCIN 2 % EX OINT
1.0000 "application " | TOPICAL_OINTMENT | Freq: Two times a day (BID) | CUTANEOUS | Status: DC
  Administered 2014-04-11 (×2): 1 via NASAL
  Filled 2014-04-11: qty 22

## 2014-04-11 MED ORDER — PROPOFOL 10 MG/ML IV BOLUS
INTRAVENOUS | Status: AC
  Filled 2014-04-11: qty 20

## 2014-04-11 MED ORDER — PROMETHAZINE HCL 25 MG/ML IJ SOLN
INTRAMUSCULAR | Status: AC
  Filled 2014-04-11: qty 1

## 2014-04-11 MED ORDER — NEOSTIGMINE METHYLSULFATE 10 MG/10ML IV SOLN
INTRAVENOUS | Status: DC | PRN
  Administered 2014-04-11: 5 mg via INTRAVENOUS

## 2014-04-11 MED ORDER — LACTATED RINGERS IV SOLN
INTRAVENOUS | Status: DC | PRN
  Administered 2014-04-11: 08:00:00 via INTRAVENOUS

## 2014-04-11 MED ORDER — NEOSTIGMINE METHYLSULFATE 10 MG/10ML IV SOLN
INTRAVENOUS | Status: AC
  Filled 2014-04-11: qty 1

## 2014-04-11 MED ORDER — ONDANSETRON HCL 4 MG/2ML IJ SOLN: 4.0000 mg | Freq: Four times a day (QID) | INTRAMUSCULAR | Status: DC | PRN

## 2014-04-11 SURGICAL SUPPLY — 49 items
APL SKNCLS STERI-STRIP NONHPOA (GAUZE/BANDAGES/DRESSINGS) ×2
APPLIER CLIP 5 13 M/L LIGAMAX5 (MISCELLANEOUS)
APR CLP MED LRG 5 ANG JAW (MISCELLANEOUS)
BENZOIN TINCTURE PRP APPL 2/3 (GAUZE/BANDAGES/DRESSINGS) ×2 IMPLANT
CABLE HIGH FREQUENCY MONO STRZ (ELECTRODE) ×2 IMPLANT
CANISTER SUCTION 2500CC (MISCELLANEOUS) ×1 IMPLANT
CHLORAPREP W/TINT 26ML (MISCELLANEOUS) ×2 IMPLANT
CLIP APPLIE 5 13 M/L LIGAMAX5 (MISCELLANEOUS) IMPLANT
CLIP LIGATING HEMO O LOK GREEN (MISCELLANEOUS) ×2 IMPLANT
COVER MAYO STAND STRL (DRAPES) IMPLANT
COVER TRANSDUCER ULTRASND (DRAPES) ×2 IMPLANT
DECANTER SPIKE VIAL GLASS SM (MISCELLANEOUS) ×1 IMPLANT
DEVICE TROCAR PUNCTURE CLOSURE (ENDOMECHANICALS) ×2 IMPLANT
DRAPE C-ARM 42X120 X-RAY (DRAPES) IMPLANT
DRAPE LAPAROSCOPIC ABDOMINAL (DRAPES) ×2 IMPLANT
DRAPE UTILITY XL STRL (DRAPES) ×1 IMPLANT
ELECT REM PT RETURN 9FT ADLT (ELECTROSURGICAL) ×2
ELECTRODE REM PT RTRN 9FT ADLT (ELECTROSURGICAL) ×1 IMPLANT
GAUZE SPONGE 2X2 8PLY STRL LF (GAUZE/BANDAGES/DRESSINGS) ×1 IMPLANT
GAUZE SPONGE 4X4 12PLY STRL (GAUZE/BANDAGES/DRESSINGS) ×2 IMPLANT
GAUZE XEROFORM 2X2 STRL (GAUZE/BANDAGES/DRESSINGS) ×1 IMPLANT
GLOVE BIO SURGEON STRL SZ7 (GLOVE) ×1 IMPLANT
GLOVE BIO SURGEON STRL SZ7.5 (GLOVE) ×2 IMPLANT
GLOVE BIOGEL PI IND STRL 7.0 (GLOVE) IMPLANT
GLOVE BIOGEL PI INDICATOR 7.0 (GLOVE) ×2
GLOVE SURG SS PI 6.5 STRL IVOR (GLOVE) ×1 IMPLANT
GOWN STRL REUS W/TWL XL LVL3 (GOWN DISPOSABLE) ×6 IMPLANT
HEMOSTAT SURGICEL 4X8 (HEMOSTASIS) IMPLANT
KIT BASIN OR (CUSTOM PROCEDURE TRAY) ×2 IMPLANT
MANIFOLD NEPTUNE II (INSTRUMENTS) ×1 IMPLANT
NDL INSUFFLATION 14GA 120MM (NEEDLE) ×1 IMPLANT
NEEDLE INSUFFLATION 14GA 120MM (NEEDLE) ×2 IMPLANT
SCISSORS LAP 5X35 DISP (ENDOMECHANICALS) ×2 IMPLANT
SET CHOLANGIOGRAPH MIX (MISCELLANEOUS) IMPLANT
SET IRRIG TUBING LAPAROSCOPIC (IRRIGATION / IRRIGATOR) ×2 IMPLANT
SOLUTION ANTI FOG 6CC (MISCELLANEOUS) ×2 IMPLANT
SPONGE GAUZE 2X2 STER 10/PKG (GAUZE/BANDAGES/DRESSINGS) ×1
STRIP CLOSURE SKIN 1/2X4 (GAUZE/BANDAGES/DRESSINGS) ×3 IMPLANT
SUT MNCRL AB 4-0 PS2 18 (SUTURE) ×3 IMPLANT
TAPE CLOTH SOFT 2X10 (GAUZE/BANDAGES/DRESSINGS) ×1 IMPLANT
TOWEL OR 17X26 10 PK STRL BLUE (TOWEL DISPOSABLE) ×2 IMPLANT
TOWEL OR NON WOVEN STRL DISP B (DISPOSABLE) ×2 IMPLANT
TRAY LAP CHOLE (CUSTOM PROCEDURE TRAY) ×2 IMPLANT
TROCAR BLADELESS OPT 5 100 (ENDOMECHANICALS) ×1 IMPLANT
TROCAR BLADELESS OPT 5 75 (ENDOMECHANICALS) ×1 IMPLANT
TROCAR SLEEVE XCEL 5X75 (ENDOMECHANICALS) ×1 IMPLANT
TROCAR XCEL NON-BLD 11X100MML (ENDOMECHANICALS) ×2 IMPLANT
TROCAR XCEL NON-BLD 5MMX100MML (ENDOMECHANICALS) ×2 IMPLANT
TUBING INSUFFLATION 10FT LAP (TUBING) ×2 IMPLANT

## 2014-04-11 NOTE — Op Note (Signed)
04/09/2014 - 04/11/2014  9:30 AM  PATIENT:  Monique Duncan  28 y.o. female  PRE-OPERATIVE DIAGNOSIS:  dyskensia  POST-OPERATIVE DIAGNOSIS:  Chronic inflammation  PROCEDURE:  Procedure(s): LAPAROSCOPIC CHOLECYSTECTOMY (N/A)  SURGEON:  Surgeon(s) and Role:    * Ralene Ok, MD - Primary  ASSISTANTS: none   ANESTHESIA:   local and general  EBL:  Total I/O In: 325 [I.V.:325] Out: 200 [Urine:200]  BLOOD ADMINISTERED:none  DRAINS: none   LOCAL MEDICATIONS USED:  BUPIVICAINE   SPECIMEN:  Source of Specimen:  gallbladder  DISPOSITION OF SPECIMEN:  PATHOLOGY  COUNTS:  YES  TOURNIQUET:  * No tourniquets in log *  DICTATION: .Dragon Dictation  Findings:Chronic inflammation of the gallbladder  Details of the procedure: The patient was taken to the operating and placed in the supine position with bilateral SCDs in place. A time out was called and all facts were verified. A pneumoperitoneum was obtained via A Veress needle technique to a pressure of 20mm of mercury. A 35mm trochar was then placed in the right upper quadrant under visualization, and there were no injuries to any abdominal organs. A 11 mm port was then placed in the umbilical region after infiltrating with local anesthesia under direct visualization. A second epigastric port was placed under direct visualization. The gallbladder was identified and retracted, the peritoneum was then sharply dissected from the gallbladder and this dissection was carried down to Calot's triangle. The cystic duct was identified and stripped away circumferentially and seen going into the gallbladder 360, the critical angle was obtained.  2 clips were placed proximally one distally and the cystic duct transected. The cystic artery was identified and 2 clips placed proximally and one distally and transected. We then proceeded to remove the gallbladder off the hepatic fossa with Bovie cautery. A retrieval bag was then placed in the abdomen and  gallbladder placed in the bag. The hepatic fossa was then reexamined and hemostasis was achieved with Bovie cautery and was excellent at this portion of the case. The subhepatic fossa and perihepatic fossa was then irrigated until the effluent was clear. The 11 mm trocar fascia was reapproximated with the Endo Close #1 Vicryl x1. The pneumoperitoneum was evacuated and all trochars removed under direct visulalization. The skin was then closed with 4-0 Monocryl and the skin dressed with Steri-Strips, gauze, and tape. The patient was awaken from general anesthesia and taken to the recovery room in stable condition.    PLAN OF CARE: already admitted  PATIENT DISPOSITION:  PACU - hemodynamically stable.   Delay start of Pharmacological VTE agent (>24hrs) due to surgical blood loss or risk of bleeding: not applicable

## 2014-04-11 NOTE — Progress Notes (Signed)
Utilization Review Completed.Monique Duncan T9/16/2015  

## 2014-04-11 NOTE — Anesthesia Preprocedure Evaluation (Signed)
Anesthesia Evaluation  Patient identified by MRN, date of birth, ID band Patient awake    Reviewed: Allergy & Precautions, H&P , NPO status , Patient's Chart, lab work & pertinent test results  Airway Mallampati: III TM Distance: <3 FB Neck ROM: Full    Dental no notable dental hx.    Pulmonary neg pulmonary ROS,  breath sounds clear to auscultation  Pulmonary exam normal       Cardiovascular negative cardio ROS  Rhythm:Regular Rate:Normal     Neuro/Psych negative neurological ROS  negative psych ROS   GI/Hepatic negative GI ROS, Neg liver ROS,   Endo/Other  Morbid obesity  Renal/GU negative Renal ROS  negative genitourinary   Musculoskeletal negative musculoskeletal ROS (+)   Abdominal   Peds negative pediatric ROS (+)  Hematology negative hematology ROS (+)   Anesthesia Other Findings   Reproductive/Obstetrics negative OB ROS                           Anesthesia Physical Anesthesia Plan  ASA: III  Anesthesia Plan: General   Post-op Pain Management:    Induction: Intravenous  Airway Management Planned: Oral ETT  Additional Equipment:   Intra-op Plan:   Post-operative Plan: Extubation in OR  Informed Consent: I have reviewed the patients History and Physical, chart, labs and discussed the procedure including the risks, benefits and alternatives for the proposed anesthesia with the patient or authorized representative who has indicated his/her understanding and acceptance.   Dental advisory given  Plan Discussed with: CRNA and Surgeon  Anesthesia Plan Comments:         Anesthesia Quick Evaluation

## 2014-04-11 NOTE — Progress Notes (Signed)
Patient c/o pressure to chest area. Denied having pain. A stat EKG was done which showed NSR with sinus arrhythmia. V/S t-98, hr-63, bp-152/81 and o2 sats 100% on RA. Spoke with on-call MD re pt's complaints and a one time dose of Mylanta was ordered. However, pt refused medication and stated " I do not do good with liquids. I will vomit if I drink it". At time of writing pt is asleep. Will monitor for any increased discomfort.

## 2014-04-11 NOTE — Anesthesia Postprocedure Evaluation (Signed)
  Anesthesia Post-op Note  Patient: Monique Duncan  Procedure(s) Performed: Procedure(s) (LRB): LAPAROSCOPIC CHOLECYSTECTOMY (N/A)  Patient Location: PACU  Anesthesia Type: General  Level of Consciousness: awake and alert   Airway and Oxygen Therapy: Patient Spontanous Breathing  Post-op Pain: mild  Post-op Assessment: Post-op Vital signs reviewed, Patient's Cardiovascular Status Stable, Respiratory Function Stable, Patent Airway and No signs of Nausea or vomiting  Last Vitals:  Filed Vitals:   04/11/14 0450  BP: 118/68  Pulse: 62  Temp: 36.9 C  Resp: 18    Post-op Vital Signs: stable   Complications: No apparent anesthesia complications

## 2014-04-11 NOTE — Transfer of Care (Signed)
Immediate Anesthesia Transfer of Care Note  Patient: Monique Duncan  Procedure(s) Performed: Procedure(s): LAPAROSCOPIC CHOLECYSTECTOMY (N/A)  Patient Location: PACU  Anesthesia Type:General  Level of Consciousness: awake, alert  and oriented  Airway & Oxygen Therapy: Patient Spontanous Breathing and Patient connected to face mask oxygen  Post-op Assessment: Report given to PACU RN and Post -op Vital signs reviewed and stable  Post vital signs: Reviewed and stable  Complications: No apparent anesthesia complications

## 2014-04-12 ENCOUNTER — Encounter (HOSPITAL_COMMUNITY): Payer: Self-pay | Admitting: General Surgery

## 2014-04-12 MED ORDER — OXYCODONE-ACETAMINOPHEN 5-325 MG PO TABS: 1.0000 | ORAL_TABLET | ORAL | Status: DC | PRN

## 2014-04-12 NOTE — Discharge Summary (Signed)
Physician Discharge Summary  Patient ID: Monique Duncan MRN: [id number] DOB/AGE: 1985/10/29 28 y.o.  Admit date: 04/09/2014 Discharge date: 04/12/2014  Admission Diagnoses: biliary colic  Discharge Diagnoses:  Principal Problem:   Abdominal pain Active Problems:   Biliary dyskinesia   Uterine fibroid   Morbid obesity   Discharged Condition: good  Hospital Course: Pt was admitted for biliary colic.  She underwent Lap chole.  Pt had goo dpain relieve post op.  She was started on a CLD and adv to a low fat diet as tol.  She was ambulating well on her own.  She was afebrile, deems stable for DC and [facility] home.  Consults: None  Significant Diagnostic Studies: none  Treatments: surgery: as above  Discharge Exam: Blood pressure 108/69, pulse 62, temperature 98.2 F (36.8 C), temperature source Oral, resp. rate 16, height 5\' 3"  (1.6 m), weight 296 lb 11.8 oz (134.6 kg), last menstrual period 04/05/2014, SpO2 94.00%. General appearance: alert and cooperative Resp: clear to auscultation bilaterally Cardio: regular rate and rhythm, S1, S2 normal, no murmur, click, rub or gallop GI: soft, non-tender; bowel sounds normal; no masses,  no organomegaly  Disposition: Final discharge disposition not confirmed  Discharge Instructions   Diet - low sodium heart healthy    Complete by:  As directed      Increase activity slowly    Complete by:  As directed             Medication List         oxyCODONE-acetaminophen 5-325 MG per tablet  Commonly known as:  ROXICET  Take 1-2 tablets by mouth every 4 (four) hours as needed for severe pain.     TRI-SPRINTEC 0.18/0.215/0.25 MG-35 MCG tablet  Generic drug:  Norgestimate-Ethinyl Estradiol Triphasic  Take 1 tablet by mouth daily.         Signed: Rosario Jacks., Zygmund Passero 04/12/2014, 7:54 AM

## 2014-04-12 NOTE — Discharge Instructions (Signed)
[facility] ______[facility] °LAPAROSCOPIC SURGERY: POST OP INSTRUCTIONS °Always review your discharge instruction sheet given to you by the facility where your surgery was performed. °IF YOU HAVE DISABILITY OR FAMILY LEAVE FORMS, YOU MUST BRING THEM TO THE OFFICE FOR PROCESSING.   °DO NOT GIVE THEM TO YOUR DOCTOR. ° °1. A prescription for pain medication may be given to you upon discharge.  Take your pain medication as prescribed, if needed.  If narcotic pain medicine is not needed, then you may take acetaminophen (Tylenol) or ibuprofen (Advil) as needed. °2. Take your usually prescribed medications unless otherwise directed. °3. If you need a refill on your pain medication, please contact your pharmacy.  They will contact our office to request authorization. Prescriptions will not be filled after 5pm or on week-ends. °4. You should follow a light diet the first few days after arrival home, such as soup and crackers, etc.  Be sure to include lots of fluids daily. °5. Most patients will experience some swelling and bruising in the area of the incisions.  Ice packs will help.  Swelling and bruising can take several days to resolve.  °6. It is common to experience some constipation if taking pain medication after surgery.  Increasing fluid intake and taking a stool softener (such as Colace) will usually help or prevent this problem from occurring.  A mild laxative (Milk of Magnesia or Miralax) should be taken according to package instructions if there are no bowel movements after 48 hours. °7. Unless discharge instructions indicate otherwise, you may remove your bandages 24-48 hours after surgery, and you may shower at that time.  You may have steri-strips (small skin tapes) in place directly over the incision.  These strips should be left on the skin for 7-10 days.  If your surgeon used skin glue on the incision, you may shower in 24 hours.  The glue will flake off over the next 2-3 weeks.  Any sutures or  staples will be removed at the office during your follow-up visit. °8. ACTIVITIES:  You may resume regular (light) daily activities beginning the next day--such as daily self-care, walking, climbing stairs--gradually increasing activities as tolerated.  You may have sexual intercourse when it is comfortable.  Refrain from any heavy lifting or straining until approved by your doctor. °a. You may drive when you are no longer taking prescription pain medication, you can comfortably wear a seatbelt, and you can safely maneuver your car and apply brakes. °b. RETURN TO WORK:  __________________________________________________________ °9. You should see your doctor in the office for a follow-up appointment approximately 2-3 weeks after your surgery.  Make sure that you call for this appointment within a day or two after you arrive home to insure a convenient appointment time. °10. OTHER INSTRUCTIONS: __________________________________________________________________________________________________________________________ __________________________________________________________________________________________________________________________ °WHEN TO CALL YOUR DOCTOR: °1. Fever over 101.0 °2. Inability to urinate °3. Continued bleeding from incision. °4. Increased pain, redness, or drainage from the incision. °5. Increasing abdominal pain ° °The clinic staff is available to answer your questions during regular business hours.  Please don’t hesitate to call and ask to speak to one of the nurses for clinical concerns.  If you have a medical emergency, go to the nearest emergency room or call 911.  A surgeon from [facility] is always on call at the hospital. °[address], [address], [place], [place]  [address] ? [address] [address], [place], [place]   [address] °[phone] ? [phone] ? FAX [phone] °Web site:   [email or web address] °

## 2014-04-12 NOTE — Progress Notes (Signed)
Nursing Discharge Summary  Patient ID: Monique Duncan MRN: [id number] DOB/AGE: 1986/06/24 29 y.o.  Admit date: 04/09/2014 Discharge date: 04/12/2014  Discharged Condition: good  Disposition: 01-Home or Self Care    Prescriptions Given: Prescription given for Percocet. Patient medications and follow up appointments discussed. Patient and mother verbalized understanding without further questions.  Means of Discharge: Patient to be taken downstairs via wheelchair to be discharged home.   Signed: Buel Ream 04/12/2014, 10:22 AM

## 2014-07-17 ENCOUNTER — Encounter (HOSPITAL_COMMUNITY): Payer: Self-pay | Admitting: Emergency Medicine

## 2014-07-17 ENCOUNTER — Emergency Department (HOSPITAL_COMMUNITY): Payer: BC Managed Care – PPO

## 2014-07-17 ENCOUNTER — Inpatient Hospital Stay (HOSPITAL_COMMUNITY)
Admission: EM | Admit: 2014-07-17 | Discharge: 2014-07-19 | DRG: 603 | Disposition: A | Discharge status: Home / Self Care | Payer: BC Managed Care – PPO | Attending: Internal Medicine | Admitting: Internal Medicine

## 2014-07-17 DIAGNOSIS — Z91030 Bee allergy status: Secondary | ICD-10-CM

## 2014-07-17 DIAGNOSIS — L279 Dermatitis due to unspecified substance taken internally: Secondary | ICD-10-CM

## 2014-07-17 DIAGNOSIS — L270 Generalized skin eruption due to drugs and medicaments taken internally: Secondary | ICD-10-CM | POA: Diagnosis present

## 2014-07-17 DIAGNOSIS — Z888 Allergy status to other drugs, medicaments and biological substances status: Secondary | ICD-10-CM

## 2014-07-17 DIAGNOSIS — T368X5A Adverse effect of other systemic antibiotics, initial encounter: Secondary | ICD-10-CM | POA: Diagnosis present

## 2014-07-17 DIAGNOSIS — Y9289 Other specified places as the place of occurrence of the external cause: Secondary | ICD-10-CM

## 2014-07-17 DIAGNOSIS — K429 Umbilical hernia without obstruction or gangrene: Secondary | ICD-10-CM | POA: Diagnosis present

## 2014-07-17 DIAGNOSIS — Z9049 Acquired absence of other specified parts of digestive tract: Secondary | ICD-10-CM | POA: Diagnosis present

## 2014-07-17 DIAGNOSIS — L299 Pruritus, unspecified: Secondary | ICD-10-CM | POA: Diagnosis present

## 2014-07-17 DIAGNOSIS — L03319 Cellulitis of trunk, unspecified: Principal | ICD-10-CM

## 2014-07-17 DIAGNOSIS — Z79899 Other long term (current) drug therapy: Secondary | ICD-10-CM

## 2014-07-17 DIAGNOSIS — R109 Unspecified abdominal pain: Secondary | ICD-10-CM | POA: Diagnosis not present

## 2014-07-17 DIAGNOSIS — L02219 Cutaneous abscess of trunk, unspecified: Secondary | ICD-10-CM | POA: Diagnosis present

## 2014-07-17 LAB — CBC WITH DIFFERENTIAL/PLATELET
Basophils Absolute: 0 10*3/uL (ref 0.0–0.1)
Basophils Relative: 0 % (ref 0–1)
Eosinophils Absolute: 0.2 10*3/uL (ref 0.0–0.7)
Eosinophils Relative: 3 % (ref 0–5)
HCT: 36 % (ref 36.0–46.0)
Hemoglobin: 11.2 g/dL — ABNORMAL LOW (ref 12.0–15.0)
Lymphocytes Relative: 28 % (ref 12–46)
Lymphs Abs: 1.5 10*3/uL (ref 0.7–4.0)
MCH: 27.5 pg (ref 26.0–34.0)
MCHC: 31.1 g/dL (ref 30.0–36.0)
MCV: 88.5 fL (ref 78.0–100.0)
Monocytes Absolute: 0.4 10*3/uL (ref 0.1–1.0)
Monocytes Relative: 7 % (ref 3–12)
Neutro Abs: 3.3 10*3/uL (ref 1.7–7.7)
Neutrophils Relative %: 62 % (ref 43–77)
Platelets: 268 10*3/uL (ref 150–400)
RBC: 4.07 MIL/uL (ref 3.87–5.11)
RDW: 13.9 % (ref 11.5–15.5)
WBC: 5.4 10*3/uL (ref 4.0–10.5)

## 2014-07-17 LAB — BASIC METABOLIC PANEL
Anion gap: 9 (ref 5–15)
BUN: 9 mg/dL (ref 6–23)
CO2: 23 mmol/L (ref 19–32)
Calcium: 8.6 mg/dL (ref 8.4–10.5)
Chloride: 102 mEq/L (ref 96–112)
Creatinine, Ser: 0.64 mg/dL (ref 0.50–1.10)
GFR calc Af Amer: >90 mL/min (ref >90)
GFR calc non Af Amer: >90 mL/min (ref >90)
Glucose, Bld: 92 mg/dL (ref 70–99)
Potassium: 3.8 mmol/L (ref 3.5–5.1)
Sodium: 134 mmol/L — ABNORMAL LOW (ref 135–145)

## 2014-07-17 LAB — PREGNANCY, URINE: Preg Test, Ur: NEGATIVE

## 2014-07-17 MED ORDER — HYDROMORPHONE HCL 1 MG/ML IJ SOLN
1.0000 mg | Freq: Once | INTRAMUSCULAR | Status: AC
  Administered 2014-07-17: 1 mg via INTRAVENOUS
  Filled 2014-07-17: qty 1

## 2014-07-17 MED ORDER — SODIUM CHLORIDE 0.9 % IV SOLN
2000.0000 mg | INTRAVENOUS | Status: AC
  Administered 2014-07-17: 2000 mg via INTRAVENOUS
  Filled 2014-07-17: qty 2000

## 2014-07-17 MED ORDER — HYDROMORPHONE HCL 1 MG/ML IJ SOLN
0.5000 mg | INTRAMUSCULAR | Status: DC | PRN
  Administered 2014-07-18 (×2): 1 mg via INTRAVENOUS
  Filled 2014-07-17 (×2): qty 1

## 2014-07-17 MED ORDER — VANCOMYCIN HCL 10 G IV SOLR
1250.0000 mg | Freq: Three times a day (TID) | INTRAVENOUS | Status: DC
  Administered 2014-07-18: 1250 mg via INTRAVENOUS
  Filled 2014-07-17 (×2): qty 1250

## 2014-07-17 MED ORDER — DIPHENHYDRAMINE HCL 50 MG/ML IJ SOLN
25.0000 mg | Freq: Once | INTRAMUSCULAR | Status: AC
  Administered 2014-07-17: 50 mg via INTRAVENOUS
  Filled 2014-07-17: qty 1

## 2014-07-17 MED ORDER — ONDANSETRON HCL 4 MG/2ML IJ SOLN
4.0000 mg | Freq: Once | INTRAMUSCULAR | Status: AC
  Administered 2014-07-17: 4 mg via INTRAVENOUS
  Filled 2014-07-17: qty 2

## 2014-07-17 MED ORDER — SODIUM CHLORIDE 0.9 % IV BOLUS (SEPSIS)
1000.0000 mL | INTRAVENOUS | Status: AC
  Administered 2014-07-17: 1000 mL via INTRAVENOUS

## 2014-07-17 MED ORDER — SODIUM CHLORIDE 0.9 % IJ SOLN
3.0000 mL | Freq: Two times a day (BID) | INTRAMUSCULAR | Status: DC
Start: 2014-07-17 — End: 2014-07-19

## 2014-07-17 MED ORDER — NORGESTIM-ETH ESTRAD TRIPHASIC 0.18/0.215/0.25 MG-35 MCG PO TABS
1.0000 | ORAL_TABLET | Freq: Every day | ORAL | Status: DC
  Administered 2014-07-18 – 2014-07-19 (×2): 1 via ORAL

## 2014-07-17 MED ORDER — IOHEXOL 300 MG/ML  SOLN
100.0000 mL | Freq: Once | INTRAMUSCULAR | Status: AC | PRN
  Administered 2014-07-17: 100 mL via INTRAVENOUS

## 2014-07-17 MED ORDER — HEPARIN SODIUM (PORCINE) 5000 UNIT/ML IJ SOLN
5000.0000 [IU] | Freq: Three times a day (TID) | INTRAMUSCULAR | Status: DC
  Administered 2014-07-18 – 2014-07-19 (×5): 5000 [IU] via SUBCUTANEOUS
  Filled 2014-07-17 (×8): qty 1

## 2014-07-17 NOTE — ED Notes (Signed)
Pt had her gallbladder removed here in September.  States that on Monday, she began having a rash near the place where her stitches used to be. Rash has developed into a cellulitis that covers almost her entire abd.  Pt sent by doctor.  Afebrile.

## 2014-07-17 NOTE — Progress Notes (Signed)
ANTIBIOTIC CONSULT NOTE - INITIAL  Pharmacy Consult for Vancomycin Indication: Cellulitis  Allergies  Allergen Reactions  . Bee Venom Swelling    Patient Measurements:   Adjusted Body Weight:   Vital Signs: Temp: 98.2 F (36.8 C) (12/22 1808) Temp Source: Oral (12/22 1808) BP: 152/94 mmHg (12/22 1808) Pulse Rate: 67 (12/22 1808) Intake/Output from previous day:   Intake/Output from this shift:    Labs: No results for input(s): WBC, HGB, PLT, LABCREA, CREATININE in the last 72 hours. CrCl cannot be calculated (Unknown ideal weight.). No results for input(s): VANCOTROUGH, VANCOPEAK, VANCORANDOM, GENTTROUGH, GENTPEAK, GENTRANDOM, TOBRATROUGH, TOBRAPEAK, TOBRARND, AMIKACINPEAK, AMIKACINTROU, AMIKACIN in the last 72 hours.   Microbiology: No results found for this or any previous visit (from the past 720 hour(s)).  Medical History: History reviewed. No pertinent past medical history.  Assessment: 3 yoF s/p cholecystectomy 9/'15 presents with cellulitis with purulent and bloody drainage near site of stiches.  Appears pt failed outpatient cephalexin and bactrim.  Pharmacy consulted to start vancomycin.   12/22 >> Vancomcyin  >>  Tmax: AF WBCs: WNL Renal: CrCl >100 (CG and N)  Goal of Therapy:  Vancomycin trough level 10-15 mcg/ml  Plan:  Vancomcyin 2g IV x 1 now, then 1250mg  IV q8h F/u renal fxn and VT as warranted  Ralene Bathe, PharmD, BCPS 07/17/2014, 7:10 PM  Pager: [phone]

## 2014-07-17 NOTE — ED Notes (Signed)
Bedside report received by previous RN, [employer].

## 2014-07-17 NOTE — ED Notes (Signed)
Pt's color returning to normal. Still some redness around pt's eyes and forehead. Mom is at bedside.

## 2014-07-17 NOTE — ED Provider Notes (Signed)
CSN: [id number]     Arrival date & time 07/17/14  1759 History   First MD Initiated Contact with Patient 07/17/14 1819     Chief Complaint  Patient presents with  . Cellulitis   (Consider location/radiation/quality/duration/timing/severity/associated sxs/prior Treatment) HPI Monique Duncan is a 28 yo female presenting with report of skin redness onset 8 days ago.  She states she had a lap cholecystectomy in September and had been doing well.  She first noticed some mild periumbilical redness followed by pain the following day.  Then 4 days ago the pain became significantly worse and the redness became larger.  She was seen in the emergency department and discharged on bactrim and keflex.  The pain and redness did not change but today it began draining purulent and bloody drainage.  She denies any fevers, chills, nausea, or vomiting.    History reviewed. No pertinent past medical history. Past Surgical History  Procedure Laterality Date  . Tonsillectomy    . Myringotomy with tube placement    . Cholecystectomy N/A 04/11/2014    Procedure: LAPAROSCOPIC CHOLECYSTECTOMY;  Surgeon: Ralene Ok, MD;  Location: [facility];  Service: General;  Laterality: N/A;   Family History  Problem Relation Age of Onset  . Depression Mother   . Migraines Mother    History  Substance Use Topics  . Smoking status: Never Smoker   . Smokeless tobacco: Never Used  . Alcohol Use: No   OB History    No data available     Review of Systems  Constitutional: Negative for fever and chills.  HENT: Negative for sore throat.   Eyes: Negative for visual disturbance.  Respiratory: Negative for cough and shortness of breath.   Cardiovascular: Negative for chest pain and leg swelling.  Gastrointestinal: Negative for nausea, vomiting and diarrhea.  Genitourinary: Negative for dysuria.  Musculoskeletal: Negative for myalgias.  Skin: Positive for color change and rash.  Neurological: Negative for weakness,  numbness and headaches.    Allergies  Bee venom  Home Medications   Prior to Admission medications   Medication Sig Start Date End Date Taking? Authorizing Provider  cephALEXin (KEFLEX) 500 MG capsule Take 500 mg by mouth 4 (four) times daily.   Yes Historical Provider, MD  Norgestimate-Ethinyl Estradiol Triphasic (TRI-SPRINTEC) 0.18/0.215/0.25 MG-35 MCG tablet Take 1 tablet by mouth daily.   Yes Historical Provider, MD  sulfamethoxazole-trimethoprim (BACTRIM DS,SEPTRA DS) 800-160 MG per tablet Take 1 tablet by mouth 2 (two) times daily.   Yes Historical Provider, MD  oxyCODONE-acetaminophen (ROXICET) 5-325 MG per tablet Take 1-2 tablets by mouth every 4 (four) hours as needed for severe pain. Patient not taking: Reported on 07/17/2014 04/12/14   Ralene Ok, MD   BP 152/94 mmHg  Pulse 67  Temp(Src) 98.2 F (36.8 C) (Oral)  Resp 18  SpO2 100% Physical Exam  Constitutional: She appears well-developed and well-nourished. No distress.  HENT:  Head: Normocephalic and atraumatic.  Mouth/Throat: Oropharynx is clear and moist. No oropharyngeal exudate.  Eyes: Conjunctivae are normal.  Neck: Neck supple. No thyromegaly present.  Cardiovascular: Normal rate, regular rhythm and intact distal pulses.   Pulmonary/Chest: Effort normal and breath sounds normal. No respiratory distress. She has no wheezes. She has no rales. She exhibits no tenderness.  Abdominal: Soft. There is tenderness.    Musculoskeletal: She exhibits no tenderness.  Lymphadenopathy:    She has no cervical adenopathy.  Neurological: She is alert.  Skin: Skin is warm and dry. No rash noted. She is not  diaphoretic.  Psychiatric: She has a normal mood and affect.  Nursing note and vitals reviewed.   ED Course  Procedures (including critical care time) Labs Review Labs Reviewed  CBC WITH DIFFERENTIAL - Abnormal; Notable for the following:    Hemoglobin 11.2 (*)    All other components within normal limits  BASIC  METABOLIC PANEL - Abnormal; Notable for the following:    Sodium 134 (*)    All other components within normal limits  PREGNANCY, URINE    Imaging Review Ct Abdomen Pelvis W Contrast  07/17/2014   CLINICAL DATA:  28 year old female status post cholecystectomy with cutaneous rash at her prior incision site.  EXAM: CT ABDOMEN AND PELVIS WITH CONTRAST  TECHNIQUE: Multidetector CT imaging of the abdomen and pelvis was performed using the standard protocol following bolus administration of intravenous contrast.  CONTRAST:  176mL OMNIPAQUE IOHEXOL 300 MG/ML  SOLN  COMPARISON:  Right upper quadrant abdominal ultrasound 04/10/2014  FINDINGS: Lower Chest: Numerous small predominantly subpleural pulmonary nodules are identified bilaterally. Given the patient's age, these are almost certainly the sequelae of a prior infectious/ inflammatory or granulomatous process. No further imaging followup is recommended. Visualized cardiac structures are within normal limits for size. No pericardial effusion. Unremarkable visualized distal thoracic esophagus.  Abdomen: Unremarkable CT appearance of the stomach, duodenum, spleen, adrenal glands and pancreas. Normal hepatic contour and morphology. No discrete hepatic lesion. The gallbladder is surgically absent. Minimal prominence of intrahepatic bile ducts. The common bile ducts is within normal limits.  Unremarkable appearance of the bilateral kidneys. No focal solid lesion, hydronephrosis or nephrolithiasis. No evidence of obstruction or focal bowel wall thickening. Normal appendix in the right lower quadrant. The terminal ileum is unremarkable. No free fluid or suspicious adenopathy.  Pelvis: Heterogeneous and globular uterus consistent with multiple uterine fibroids. At least 2 dominant fibroids are identified. The largest is anterior and on the right measuring approximately 8.3 cm. The second is more posterior and exophytic from the fundus measuring 7.8 cm. Local mass effect  is noted on the right aspect of the bladder dome. No free fluid or suspicious adenopathy.  Bones/Soft Tissues: No acute fracture or aggressive appearing lytic or blastic osseous lesion. Small fat containing periumbilical hernia. Heterogeneous thickening of the umbilicus with associated skin thickening extending to the right, left and inferiorly. There is mild reticulation of the underlying subcutaneous fat. The findings are concerning for cellulitis. Prominent right superficial inguinal lymph nodes measuring up to 1.6 cm in short axis. These are likely reactive.  Vascular: No significant atherosclerotic vascular disease, aneurysmal dilatation or acute abnormality.  IMPRESSION: 1. Umbilical phlegmon/inflammation extending to involve the periumbilical skin across the abdomen and inferiorly along the pannus consistent with cellulitis. No intraperitoneal extension. 2. Small fat containing periumbilical hernia. 3. Enlarged fibroid uterus. 4. Minimal intrahepatic biliary ductal dilatation status post cholecystectomy. 5. Prominent right superficial inguinal adenopathy is likely reactive.   Electronically Signed   By: Jacqulynn Cadet M.D.   On: 07/17/2014 20:53     EKG Interpretation None      MDM   Final diagnoses:  Abdominal pain   28 yo with periumbilical cellulitis, continued to worsen despite PO bactrim and keflex, today with purulent drainage from umbilicus.  Discussed case with Dr. Vanita Panda.  Pt does not appear septic on exam and no clinical signs of peritonitis.   CBC, BMP, CT abd/pelvis, IV NS bolus, dilaudid, zofran and IV Vanc.  Labs reviewed, no acute abnormality.  CT resulted, shows umbilical inflammation and  cellulitis.   9:50 PM: Consulted Dr. Alcario Drought (hospitalist) regarding admission for IV antibiotics to treat cellulitis. Pt accepted for admission.   10:15 PM: While being evaluated by hospitalist, pt began having tachypnea and facial flushing and feeling of panic, symptoms consistent  with red man's rxn.  She was not tachycardic or hypoxic at anytime. Dr. Alcario Drought was at her bedside throughout this time, benedryl was given and symptoms improved. The patient appears reasonably stabilized for admission considering the current resources, flow, and capabilities available in the ED at this time, and I doubt any further screening and/or treatment in the ED prior to admission.    Filed Vitals:   07/17/14 1808  BP: 152/94  Pulse: 67  Temp: 98.2 F (36.8 C)  TempSrc: Oral  Resp: 18  SpO2: 100%   Meds given in ED:  Medications  sodium chloride 0.9 % bolus 1,000 mL (not administered)    New Prescriptions   No medications on file      Britt Bottom, NP 07/18/14 1253  Carmin Muskrat, MD 07/18/14 2247

## 2014-07-17 NOTE — ED Notes (Signed)
Pt turned very red and became very anxious. Dr. Alcario Drought at bedside. 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 Pharmacy notified. VS WNL.

## 2014-07-17 NOTE — ED Notes (Signed)
Monique Duncan, [place] notified this writer about patient complaining of "not feeling well" and color being red before "red mans syndrome" occurred. This writer went in to check on patient once notified and patient complained of feeling nauseous and just did not look good. Notified Dola Argyle, RN. Charge and primary nurse were present at the time of patient's panic.

## 2014-07-17 NOTE — ED Notes (Signed)
Pt aroused with stimulation. Staring blankly but responds appropriately to direct questions from this RN. MD at bedside during assessment.

## 2014-07-17 NOTE — H&P (Signed)
[facility] History and Physical  Monique Duncan OXB:[id number] DOB: Apr 28, 1986 DOA: 07/17/2014  Referring physician: EDP PCP: Wynelle Fanny   Chief Complaint: Abscess of abdomen   HPI: Monique Duncan is a 28 y.o. female who presents to the ED with cellulitis and abscess of the umbilicus.  Symptoms onset with skin redness 8 days ago, had lap chole in Sept and had been doing well since then.  First noticed some mild periumbilical redness followed by pain following day.  4 days ago pain became significantly worse and redness much larger.  Seen in ED at that time, started on keflex and bactrim.  Since then redness has not changed, but today her umbilicus began draining purulent material.  No fevers, chills, N/V.  Review of Systems: Systems reviewed.  As above, otherwise negative  Past Medical History  Diagnosis Date  . Medical history non-contributory    Past Surgical History  Procedure Laterality Date  . Tonsillectomy    . Myringotomy with tube placement    . Cholecystectomy N/A 04/11/2014    Procedure: LAPAROSCOPIC CHOLECYSTECTOMY;  Surgeon: Ralene Ok, MD;  Location: [facility];  Service: General;  Laterality: N/A;   Social History:  reports that she has never smoked. She has never used smokeless tobacco. She reports that she does not drink alcohol or use illicit drugs.  Allergies  Allergen Reactions  . Bee Venom Swelling  . 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    Family History  Problem Relation Age of Onset  . Depression Mother   . Migraines Mother      Prior to Admission medications   Medication Sig Start Date End Date Taking? Authorizing Provider  cephALEXin (KEFLEX) 500 MG capsule Take 500 mg by mouth 4 (four) times daily.   Yes Historical Provider, MD  Norgestimate-Ethinyl Estradiol Triphasic (TRI-SPRINTEC) 0.18/0.215/0.25 MG-35 MCG tablet Take 1 tablet by mouth daily.   Yes  Historical Provider, MD  sulfamethoxazole-trimethoprim (BACTRIM DS,SEPTRA DS) 800-160 MG per tablet Take 1 tablet by mouth 2 (two) times daily.   Yes Historical Provider, MD  oxyCODONE-acetaminophen (ROXICET) 5-325 MG per tablet Take 1-2 tablets by mouth every 4 (four) hours as needed for severe pain. Patient not taking: Reported on 07/17/2014 04/12/14   Ralene Ok, MD   Physical Exam: Filed Vitals:   07/17/14 2230  BP: 151/83  Pulse: 69  Temp:   Resp: 15    BP 151/83 mmHg  Pulse 69  Temp(Src) 98.2 F (36.8 C) (Oral)  Resp 15  Ht 5\' 3"  (1.6 m)  Wt 131.543 kg (290 lb)  BMI 51.38 kg/m2  SpO2 100%  LMP 07/02/2014  General Appearance:    Alert, oriented, no distress, appears stated age  Head:    Normocephalic, atraumatic  Eyes:    PERRL, EOMI, sclera non-icteric        Nose:   Nares without drainage or epistaxis. Mucosa, turbinates normal  Throat:   Moist mucous membranes. Oropharynx without erythema or exudate.  Neck:   Supple. No carotid bruits.  No thyromegaly.  No lymphadenopathy.   Back:     No CVA tenderness, no spinal tenderness  Lungs:     Clear to auscultation bilaterally, without wheezes, rhonchi or rales  Chest wall:    No tenderness to palpitation  Heart:    Regular rate and rhythm without murmurs, gallops, rubs  Abdomen:     Soft, non-tender, nondistended, normal bowel sounds, no organomegaly  Genitalia:    deferred  Rectal:    deferred  Extremities:   No clubbing, cyanosis or edema.  Pulses:   2+ and symmetric all extremities  Skin:   Abscess of umbilicus draining purulent material, surrounding erythema is at least 10+ cm.  Lymph nodes:   Cervical, supraclavicular, and axillary nodes normal  Neurologic:   CNII-XII intact. Normal strength, sensation and reflexes      throughout    Labs on Admission:  Basic Metabolic Panel:  Recent Labs Lab 07/17/14 1850  NA 134*  K 3.8  CL 102  CO2 23  GLUCOSE 92  BUN 9  CREATININE 0.64  CALCIUM 8.6   Liver  Function Tests: No results for input(s): AST, ALT, ALKPHOS, BILITOT, PROT, ALBUMIN in the last 168 hours. No results for input(s): LIPASE, AMYLASE in the last 168 hours. No results for input(s): AMMONIA in the last 168 hours. CBC:  Recent Labs Lab 07/17/14 1850  WBC 5.4  NEUTROABS 3.3  HGB 11.2*  HCT 36.0  MCV 88.5  PLT 268   Cardiac Enzymes: No results for input(s): CKTOTAL, CKMB, CKMBINDEX, TROPONINI in the last 168 hours.  BNP (last 3 results) No results for input(s): PROBNP in the last 8760 hours. CBG: No results for input(s): GLUCAP in the last 168 hours.  Radiological Exams on Admission: Ct Abdomen Pelvis W Contrast  07/17/2014   CLINICAL DATA:  28 year old female status post cholecystectomy with cutaneous rash at her prior incision site.  EXAM: CT ABDOMEN AND PELVIS WITH CONTRAST  TECHNIQUE: Multidetector CT imaging of the abdomen and pelvis was performed using the standard protocol following bolus administration of intravenous contrast.  CONTRAST:  141mL OMNIPAQUE IOHEXOL 300 MG/ML  SOLN  COMPARISON:  Right upper quadrant abdominal ultrasound 04/10/2014  FINDINGS: Lower Chest: Numerous small predominantly subpleural pulmonary nodules are identified bilaterally. Given the patient's age, these are almost certainly the sequelae of a prior infectious/ inflammatory or granulomatous process. No further imaging followup is recommended. Visualized cardiac structures are within normal limits for size. No pericardial effusion. Unremarkable visualized distal thoracic esophagus.  Abdomen: Unremarkable CT appearance of the stomach, duodenum, spleen, adrenal glands and pancreas. Normal hepatic contour and morphology. No discrete hepatic lesion. The gallbladder is surgically absent. Minimal prominence of intrahepatic bile ducts. The common bile ducts is within normal limits.  Unremarkable appearance of the bilateral kidneys. No focal solid lesion, hydronephrosis or nephrolithiasis. No evidence of  obstruction or focal bowel wall thickening. Normal appendix in the right lower quadrant. The terminal ileum is unremarkable. No free fluid or suspicious adenopathy.  Pelvis: Heterogeneous and globular uterus consistent with multiple uterine fibroids. At least 2 dominant fibroids are identified. The largest is anterior and on the right measuring approximately 8.3 cm. The second is more posterior and exophytic from the fundus measuring 7.8 cm. Local mass effect is noted on the right aspect of the bladder dome. No free fluid or suspicious adenopathy.  Bones/Soft Tissues: No acute fracture or aggressive appearing lytic or blastic osseous lesion. Small fat containing periumbilical hernia. Heterogeneous thickening of the umbilicus with associated skin thickening extending to the right, left and inferiorly. There is mild reticulation of the underlying subcutaneous fat. The findings are concerning for cellulitis. Prominent right superficial inguinal lymph nodes measuring up to 1.6 cm in short axis. These are likely reactive.  Vascular: No significant atherosclerotic vascular disease, aneurysmal dilatation or acute abnormality.  IMPRESSION: 1. Umbilical phlegmon/inflammation extending to involve the periumbilical skin across the abdomen and inferiorly along  the pannus consistent with cellulitis. No intraperitoneal extension. 2. Small fat containing periumbilical hernia. 3. Enlarged fibroid uterus. 4. Minimal intrahepatic biliary ductal dilatation status post cholecystectomy. 5. Prominent right superficial inguinal adenopathy is likely reactive.   Electronically Signed   By: Jacqulynn Cadet M.D.   On: 07/17/2014 20:53    EKG: Independently reviewed.  Assessment/Plan Principal Problem:   Cellulitis and abscess of trunk Active Problems:   Red man syndrome   1. Cellulitis and abscess of abdomen - failed outpatient treatment with bactrim and keflex 1. Will use vancomycin as our second line therapy for this  infection, suspect MRSA as most likely agent. 2. Abscess culture is pending. 3. Dilaudid for pain control, caution when using dilaudid with benadryl as patient became very sleepy in ED. 2. 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 Patient had red man syndrome to vanc infusion in ED 2. Symptoms improved after 50mg  IV benadryl 3. Patient is very sleepy but does awaken to voice and answers questions appropriately after the benadryl, indicates she is having no difficulty with respirations please use caution on floor when patient is receiving dilaudid for pain control. 4. Pharmacy has already updated orders for vanc administration given red mans syndrome.    Code Status: Full Code  Family Communication: Mother at bedside Disposition Plan: Admit to inpatient   Time spent: 70 min  Jovi Zavadil M. [facility] Pager [phone]  If 7AM-7PM, please contact the day team taking care of the patient [email or web address] Password [facility] 07/17/2014, 10:47 PM

## 2014-07-18 LAB — CBC
HCT: 34.1 % — ABNORMAL LOW (ref 36.0–46.0)
Hemoglobin: 10.8 g/dL — ABNORMAL LOW (ref 12.0–15.0)
MCH: 27.8 pg (ref 26.0–34.0)
MCHC: 31.7 g/dL (ref 30.0–36.0)
MCV: 87.7 fL (ref 78.0–100.0)
Platelets: 205 10*3/uL (ref 150–400)
RBC: 3.89 MIL/uL (ref 3.87–5.11)
RDW: 13.8 % (ref 11.5–15.5)
WBC: 5.7 10*3/uL (ref 4.0–10.5)

## 2014-07-18 LAB — BASIC METABOLIC PANEL
Anion gap: 4 — ABNORMAL LOW (ref 5–15)
BUN: 8 mg/dL (ref 6–23)
CO2: 26 mmol/L (ref 19–32)
Calcium: 8.2 mg/dL — ABNORMAL LOW (ref 8.4–10.5)
Chloride: 106 mEq/L (ref 96–112)
Creatinine, Ser: 0.56 mg/dL (ref 0.50–1.10)
GFR calc Af Amer: >90 mL/min (ref >90)
GFR calc non Af Amer: >90 mL/min (ref >90)
Glucose, Bld: 93 mg/dL (ref 70–99)
Potassium: 3.4 mmol/L — ABNORMAL LOW (ref 3.5–5.1)
Sodium: 136 mmol/L (ref 135–145)

## 2014-07-18 MED ORDER — DIPHENHYDRAMINE HCL 50 MG/ML IJ SOLN
25.0000 mg | Freq: Three times a day (TID) | INTRAMUSCULAR | Status: DC
  Administered 2014-07-18 – 2014-07-19 (×4): 25 mg via INTRAVENOUS
  Filled 2014-07-18: qty 1
  Filled 2014-07-18: qty 0.5
  Filled 2014-07-18: qty 1
  Filled 2014-07-18 (×2): qty 0.5
  Filled 2014-07-18 (×2): qty 1

## 2014-07-18 MED ORDER — ONDANSETRON HCL 4 MG/2ML IJ SOLN
4.0000 mg | Freq: Four times a day (QID) | INTRAMUSCULAR | Status: DC | PRN
  Administered 2014-07-18 (×2): 4 mg via INTRAVENOUS
  Filled 2014-07-18 (×2): qty 2

## 2014-07-18 MED ORDER — POTASSIUM CHLORIDE CRYS ER 20 MEQ PO TBCR
40.0000 meq | EXTENDED_RELEASE_TABLET | Freq: Two times a day (BID) | ORAL | Status: AC
  Administered 2014-07-18 (×2): 40 meq via ORAL
  Filled 2014-07-18 (×2): qty 2

## 2014-07-18 MED ORDER — DIPHENHYDRAMINE HCL 50 MG/ML IJ SOLN: 25.0000 mg | Freq: Three times a day (TID) | INTRAMUSCULAR | Status: DC | PRN

## 2014-07-18 MED ORDER — DIPHENHYDRAMINE HCL 50 MG/ML IJ SOLN
25.0000 mg | Freq: Three times a day (TID) | INTRAMUSCULAR | Status: DC | PRN
  Administered 2014-07-18: 25 mg via INTRAVENOUS
  Filled 2014-07-18: qty 1

## 2014-07-18 MED ORDER — VANCOMYCIN HCL 10 G IV SOLR
1250.0000 mg | Freq: Three times a day (TID) | INTRAVENOUS | Status: DC
  Administered 2014-07-18 – 2014-07-19 (×4): 1250 mg via INTRAVENOUS
  Filled 2014-07-18 (×4): qty 1250

## 2014-07-18 NOTE — Progress Notes (Signed)
[facility] PROGRESS NOTE  Jessee Newnam HAL:[id number] DOB: [birth year] DOA: 07/17/2014 PCP: Wynelle Fanny Interim summary: Monique Duncan is a 28 y.o. female who presents to the ED with cellulitis  Around the umbilicus since 8 days. She was admitted to medical service for IV antibiotics.  Assessment/Plan: 1. Cellulitis of the lower abdomen : CT abdomen does not show underlying abscess.  - started her on IV vancomycin for possible MRSA COverage in view of there lap chole in sep.   Red man syndrome: Symptoms improved much after the benadryl.  Slow infusion of the vancomycin.   Code Status: full code Family Communication: none at bedside Disposition Plan: pending.    Consultants:  none  Procedures:  none  Antibiotics:  Vancomycin 12/22  HPI/Subjective: Pain better, itching is better.   Objective: Filed Vitals:   07/18/14 0444  BP: 128/79  Pulse: 69  Temp: 97.5 F (36.4 C)  Resp: 18    Intake/Output Summary (Last 24 hours) at 07/18/14 1125 Last data filed at 07/18/14 0948  Gross per 24 hour  Intake    240 ml  Output    400 ml  Net   -160 ml   Filed Weights   07/17/14 1900 07/17/14 2340  Weight: 131.543 kg (290 lb) 130.2 kg (287 lb 0.6 oz)    Exam:   General:  Alert afebrile comfortable  Cardiovascular: s1s2  Respiratory: ctab  Abdomen: soft mildly tender int he  Cellulitis area. Erythema is improving.   Musculoskeletal: no pedal edema.   Data Reviewed: Basic Metabolic Panel:  Recent Labs Lab 07/17/14 1850 07/18/14 0410  NA 134* 136  K 3.8 3.4*  CL 102 106  CO2 23 26  GLUCOSE 92 93  BUN 9 8  CREATININE 0.64 0.56  CALCIUM 8.6 8.2*   Liver Function Tests: No results for input(s): AST, ALT, ALKPHOS, BILITOT, PROT, ALBUMIN in the last 168 hours. No results for input(s): LIPASE, AMYLASE in the last 168 hours. No results for input(s): AMMONIA in the last 168 hours. CBC:  Recent Labs Lab 07/17/14 1850 07/18/14 0410   WBC 5.4 5.7  NEUTROABS 3.3  --   HGB 11.2* 10.8*  HCT 36.0 34.1*  MCV 88.5 87.7  PLT 268 205   Cardiac Enzymes: No results for input(s): CKTOTAL, CKMB, CKMBINDEX, TROPONINI in the last 168 hours. BNP (last 3 results) No results for input(s): PROBNP in the last 8760 hours. CBG: No results for input(s): GLUCAP in the last 168 hours.  No results found for this or any previous visit (from the past 240 hour(s)).   Studies: Ct Abdomen Pelvis W Contrast  07/17/2014   CLINICAL DATA:  28 year old female status post cholecystectomy with cutaneous rash at her prior incision site.  EXAM: CT ABDOMEN AND PELVIS WITH CONTRAST  TECHNIQUE: Multidetector CT imaging of the abdomen and pelvis was performed using the standard protocol following bolus administration of intravenous contrast.  CONTRAST:  167mL OMNIPAQUE IOHEXOL 300 MG/ML  SOLN  COMPARISON:  Right upper quadrant abdominal ultrasound 04/10/2014  FINDINGS: Lower Chest: Numerous small predominantly subpleural pulmonary nodules are identified bilaterally. Given the patient's age, these are almost certainly the sequelae of a prior infectious/ inflammatory or granulomatous process. No further imaging followup is recommended. Visualized cardiac structures are within normal limits for size. No pericardial effusion. Unremarkable visualized distal thoracic esophagus.  Abdomen: Unremarkable CT appearance of the stomach, duodenum, spleen, adrenal glands and pancreas. Normal hepatic contour and morphology. No discrete hepatic lesion. The gallbladder is surgically absent. Minimal  prominence of intrahepatic bile ducts. The common bile ducts is within normal limits.  Unremarkable appearance of the bilateral kidneys. No focal solid lesion, hydronephrosis or nephrolithiasis. No evidence of obstruction or focal bowel wall thickening. Normal appendix in the right lower quadrant. The terminal ileum is unremarkable. No free fluid or suspicious adenopathy.  Pelvis:  Heterogeneous and globular uterus consistent with multiple uterine fibroids. At least 2 dominant fibroids are identified. The largest is anterior and on the right measuring approximately 8.3 cm. The second is more posterior and exophytic from the fundus measuring 7.8 cm. Local mass effect is noted on the right aspect of the bladder dome. No free fluid or suspicious adenopathy.  Bones/Soft Tissues: No acute fracture or aggressive appearing lytic or blastic osseous lesion. Small fat containing periumbilical hernia. Heterogeneous thickening of the umbilicus with associated skin thickening extending to the right, left and inferiorly. There is mild reticulation of the underlying subcutaneous fat. The findings are concerning for cellulitis. Prominent right superficial inguinal lymph nodes measuring up to 1.6 cm in short axis. These are likely reactive.  Vascular: No significant atherosclerotic vascular disease, aneurysmal dilatation or acute abnormality.  IMPRESSION: 1. Umbilical phlegmon/inflammation extending to involve the periumbilical skin across the abdomen and inferiorly along the pannus consistent with cellulitis. No intraperitoneal extension. 2. Small fat containing periumbilical hernia. 3. Enlarged fibroid uterus. 4. Minimal intrahepatic biliary ductal dilatation status post cholecystectomy. 5. Prominent right superficial inguinal adenopathy is likely reactive.   Electronically Signed   By: Jacqulynn Cadet M.D.   On: 07/17/2014 20:53    Scheduled Meds: . heparin  5,000 Units Subcutaneous 3 times per day  . Norgestimate-Ethinyl Estradiol Triphasic  1 tablet Oral Daily  . potassium chloride  40 mEq Oral BID  . sodium chloride  3 mL Intravenous Q12H  . vancomycin  1,250 mg Intravenous Q8H   Continuous Infusions:   Principal Problem:   Cellulitis and abscess of trunk Active Problems:   Red man syndrome    Time spent: 25 minutes    Kamillah Didonato  [facility] Pager [phone] If 7PM-7AM,  please contact night-coverage at [email or web address], password [facility] 07/18/2014, 11:25 AM  LOS: 1 day

## 2014-07-19 LAB — VANCOMYCIN, TROUGH: Vancomycin Tr: 20.8 ug/mL — ABNORMAL HIGH (ref 10.0–20.0)

## 2014-07-19 MED ORDER — CLINDAMYCIN HCL 300 MG PO CAPS: 300.0000 mg | ORAL_CAPSULE | Freq: Three times a day (TID) | ORAL | Status: DC

## 2014-07-19 MED ORDER — SODIUM CHLORIDE 0.9 % IV SOLN: 1250.0000 mg | Freq: Two times a day (BID) | INTRAVENOUS | Status: DC

## 2014-07-19 MED ORDER — DIPHENHYDRAMINE HCL 50 MG/ML IJ SOLN: 25.0000 mg | Freq: Three times a day (TID) | INTRAMUSCULAR | Status: DC

## 2014-07-19 MED ORDER — POTASSIUM CHLORIDE CRYS ER 20 MEQ PO TBCR: 40.0000 meq | EXTENDED_RELEASE_TABLET | Freq: Two times a day (BID) | ORAL | Status: DC

## 2014-07-19 NOTE — Progress Notes (Signed)
Pt discharged home with mother in stable condition.  Discharge instructions and scripts given. Pt verbalized understanding.

## 2014-07-19 NOTE — Discharge Summary (Signed)
Physician Discharge Summary  Monique Duncan OIN:[id number] DOB: 1985/08/14 DOA: 07/17/2014  PCP: Wynelle Fanny  Admit date: 07/17/2014 Discharge date: 07/19/2014  Time spent: 30 minutes  Recommendations for Outpatient Follow-up:  1. Follow up with PCP IN ONE WEEK   Discharge Diagnoses:  Principal Problem:   Cellulitis and abscess of trunk Active Problems:   Red man syndrome   Discharge Condition: improved  Diet recommendation: regular  Filed Weights   07/17/14 1900 07/17/14 2340  Weight: 131.543 kg (290 lb) 130.2 kg (287 lb 0.6 oz)    History of present illness:  Monique Duncan is a 28 y.o. female who presents to the ED with cellulitis Around the umbilicus since 8 days. She was admitted to medical service for IV antibiotics.   Hospital Course:  1. Cellulitis of the lower abdomen : CT abdomen does not show underlying abscess. - started her on IV vancomycin for possible MRSA COverage in view of there lap chole in sep. Her cellulitis is improving and she wanted to be discharged. She will be discharged on oral clindamycin to complete the course.   Red man syndrome: Symptoms improved much after the benadryl.  Slow infusion of the vancomycin.   Procedures:  CT ABD PELVIS  Consultations:  none  Discharge Exam: Filed Vitals:   07/19/14 1430  BP: 120/72  Pulse: 70  Temp: 97.8 F (36.6 C)  Resp: 20    General: ALERT afebrile comfortable  Cardiovascular: s1s2 Respiratory: ctab  Abd:  Redness and tenderness improving.  Discharge Instructions   Discharge Instructions    Discharge instructions    Complete by:  As directed   PLEASE follow up with PCP in one week.          Current Discharge Medication List    START taking these medications   Details  clindamycin (CLEOCIN) 300 MG capsule Take 1 capsule (300 mg total) by mouth 3 (three) times daily. Qty: 30 capsule, Refills: 0      CONTINUE these medications which have NOT CHANGED   Details   Norgestimate-Ethinyl Estradiol Triphasic (TRI-SPRINTEC) 0.18/0.215/0.25 MG-35 MCG tablet Take 1 tablet by mouth daily.      STOP taking these medications     cephALEXin (KEFLEX) 500 MG capsule      sulfamethoxazole-trimethoprim (BACTRIM DS,SEPTRA DS) 800-160 MG per tablet      oxyCODONE-acetaminophen (ROXICET) 5-325 MG per tablet        Allergies  Allergen Reactions  . Bee Venom Swelling  . 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   Follow-up Information    Follow up with [facility], PA-C. Schedule an appointment as soon as possible for a visit in 1 week.   Specialty:  Family Medicine   Contact information:   [address] [place] [address] [phone]        The results of significant diagnostics from this hospitalization (including imaging, microbiology, ancillary and laboratory) are listed below for reference.    Significant Diagnostic Studies: Ct Abdomen Pelvis W Contrast  07/17/2014   CLINICAL DATA:  28 year old female status post cholecystectomy with cutaneous rash at her prior incision site.  EXAM: CT ABDOMEN AND PELVIS WITH CONTRAST  TECHNIQUE: Multidetector CT imaging of the abdomen and pelvis was performed using the standard protocol following bolus administration of intravenous contrast.  CONTRAST:  179mL OMNIPAQUE IOHEXOL 300 MG/ML  SOLN  COMPARISON:  Right upper quadrant abdominal ultrasound 04/10/2014  FINDINGS:  Lower Chest: Numerous small predominantly subpleural pulmonary nodules are identified bilaterally. Given the patient's age, these are almost certainly the sequelae of a prior infectious/ inflammatory or granulomatous process. No further imaging followup is recommended. Visualized cardiac structures are within normal limits for size. No pericardial effusion. Unremarkable visualized distal thoracic esophagus.  Abdomen: Unremarkable CT appearance of the stomach,  duodenum, spleen, adrenal glands and pancreas. Normal hepatic contour and morphology. No discrete hepatic lesion. The gallbladder is surgically absent. Minimal prominence of intrahepatic bile ducts. The common bile ducts is within normal limits.  Unremarkable appearance of the bilateral kidneys. No focal solid lesion, hydronephrosis or nephrolithiasis. No evidence of obstruction or focal bowel wall thickening. Normal appendix in the right lower quadrant. The terminal ileum is unremarkable. No free fluid or suspicious adenopathy.  Pelvis: Heterogeneous and globular uterus consistent with multiple uterine fibroids. At least 2 dominant fibroids are identified. The largest is anterior and on the right measuring approximately 8.3 cm. The second is more posterior and exophytic from the fundus measuring 7.8 cm. Local mass effect is noted on the right aspect of the bladder dome. No free fluid or suspicious adenopathy.  Bones/Soft Tissues: No acute fracture or aggressive appearing lytic or blastic osseous lesion. Small fat containing periumbilical hernia. Heterogeneous thickening of the umbilicus with associated skin thickening extending to the right, left and inferiorly. There is mild reticulation of the underlying subcutaneous fat. The findings are concerning for cellulitis. Prominent right superficial inguinal lymph nodes measuring up to 1.6 cm in short axis. These are likely reactive.  Vascular: No significant atherosclerotic vascular disease, aneurysmal dilatation or acute abnormality.  IMPRESSION: 1. Umbilical phlegmon/inflammation extending to involve the periumbilical skin across the abdomen and inferiorly along the pannus consistent with cellulitis. No intraperitoneal extension. 2. Small fat containing periumbilical hernia. 3. Enlarged fibroid uterus. 4. Minimal intrahepatic biliary ductal dilatation status post cholecystectomy. 5. Prominent right superficial inguinal adenopathy is likely reactive.   Electronically  Signed   By: Jacqulynn Cadet M.D.   On: 07/17/2014 20:53    Microbiology: Recent Results (from the past 240 hour(s))  Wound culture     Status: None (Preliminary result)   Collection Time: 07/17/14 10:43 PM  Result Value Ref Range Status   Specimen Description WOUND ABDOMEN BELLY BUTTON  Final   Special Requests NONE  Final   Gram Stain   Final    FEW WBC PRESENT,BOTH PMN AND MONONUCLEAR RARE SQUAMOUS EPITHELIAL CELLS PRESENT NO ORGANISMS SEEN Performed at [facility]    Culture   Final    NO GROWTH 1 DAY Performed at [facility]    Report Status PENDING  Incomplete     Labs: Basic Metabolic Panel:  Recent Labs Lab 07/17/14 1850 07/18/14 0410  NA 134* 136  K 3.8 3.4*  CL 102 106  CO2 23 26  GLUCOSE 92 93  BUN 9 8  CREATININE 0.64 0.56  CALCIUM 8.6 8.2*   Liver Function Tests: No results for input(s): AST, ALT, ALKPHOS, BILITOT, PROT, ALBUMIN in the last 168 hours. No results for input(s): LIPASE, AMYLASE in the last 168 hours. No results for input(s): AMMONIA in the last 168 hours. CBC:  Recent Labs Lab 07/17/14 1850 07/18/14 0410  WBC 5.4 5.7  NEUTROABS 3.3  --   HGB 11.2* 10.8*  HCT 36.0 34.1*  MCV 88.5 87.7  PLT 268 205   Cardiac Enzymes: No results for input(s): CKTOTAL, CKMB, CKMBINDEX, TROPONINI in the last 168 hours. BNP: BNP (last  3 results) No results for input(s): PROBNP in the last 8760 hours. CBG: No results for input(s): GLUCAP in the last 168 hours.     SignedHosie Poisson  [facility] 07/19/2014, 3:14 PM

## 2014-07-19 NOTE — Progress Notes (Signed)
ANTIBIOTIC CONSULT NOTE - FOLLOW UP  Pharmacy Consult for Vancomycin Indication: Cellulitis  Allergies  Allergen Reactions  . Bee Venom Swelling  . 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    Patient Measurements: Height: 5\' 3"  (160 cm) Weight: 287 lb 0.6 oz (130.2 kg) IBW/kg (Calculated) : 52.4  Vital Signs: Temp: 97.4 F (36.3 C) (12/24 0507) Temp Source: Oral (12/24 0507) BP: 118/78 mmHg (12/24 0507) Pulse Rate: 68 (12/24 0507) Intake/Output from previous day: 12/23 0701 - 12/24 0700 In: 960 [P.O.:960] Out: 3100 [Urine:3100] Intake/Output from this shift: Total I/O In: 240 [P.O.:240] Out: 800 [Urine:800]  Labs:  Recent Labs  07/17/14 1850 07/18/14 0410  WBC 5.4 5.7  HGB 11.2* 10.8*  PLT 268 205  CREATININE 0.64 0.56   Estimated Creatinine Clearance: 139.2 mL/min (by C-G formula based on Cr of 0.56).  Recent Labs  07/19/14 1255  VANCOTROUGH 20.8*     Microbiology: Recent Results (from the past 720 hour(s))  Wound culture     Status: None (Preliminary result)   Collection Time: 07/17/14 10:43 PM  Result Value Ref Range Status   Specimen Description WOUND ABDOMEN BELLY BUTTON  Final   Special Requests NONE  Final   Gram Stain   Final    FEW WBC PRESENT,BOTH PMN AND MONONUCLEAR RARE SQUAMOUS EPITHELIAL CELLS PRESENT NO ORGANISMS SEEN Performed at [facility]    Culture   Final    NO GROWTH 1 DAY Performed at [facility]    Report Status PENDING  Incomplete    Anti-infectives    Start     Dose/Rate Route Frequency Ordered Stop   07/18/14 1400  vancomycin (VANCOCIN) 1,250 mg in sodium chloride 0.9 % 250 mL IVPB     1,250 mg125 mL/hr over 120 Minutes Intravenous Every 8 hours 07/18/14 1333     07/18/14 0600  vancomycin (VANCOCIN) 1,250 mg in sodium chloride 0.9 % 250 mL IVPB  Status:  Discontinued     1,250 mg125 mL/hr over 120 Minutes Intravenous Every 8 hours  07/17/14 2009 07/18/14 1334   07/17/14 1915  vancomycin (VANCOCIN) 2,000 mg in sodium chloride 0.9 % 500 mL IVPB     2,000 mg250 mL/hr over 120 Minutes Intravenous NOW 07/17/14 1909 07/17/14 2213      Assessment: 4 yoF s/p cholecystectomy 9/'15 presents with cellulitis with purulent and bloody drainage near site of stiches. Appears pt failed outpatient cephalexin and bactrim. Pharmacy consulted to start vancomycin.  87 yoF s/p cholecystectomy 9/'15 presents 12/22 with cellulitis with purulent and bloody drainage near site of stiches. Appears pt failed outpatient cephalexin and bactrim.CT abdomen does not show underlying abscess. Pharmacy consulted to start vancomycin.   12/22 >> Vancomcyin >>  Note: Patient developed red man syndrome during 1st dose, given IV benadryl, MD aware. Doing much better now per RN. Vancomycin doses entered to give over 120 min and linked to give diphenhydramine prior to each dose.  Today, 07/19/2014, day #3 antibiotics Tmax: AFebrile WBCs: WNL Renal: CrCl >100 ml/min (CG and N)  12/22 wound: NG x1d  Drug/Dose changes: 12/24 VT at 1300 = 20.8 (supratherapeutic) mcg/ml on 1250mg  q8h  Goal of Therapy:  Vancomycin trough level 10-15 mcg/ml  Plan:   Decrease vancomycin to 1250mg  q12h  Recheck trough at steady state if continues  Peggyann Juba, PharmD, BCPS Pager: [phone] 07/19/2014,1:53 PM

## 2014-07-20 LAB — WOUND CULTURE: Culture: NO GROWTH

## 2014-10-18 ENCOUNTER — Encounter: Payer: Self-pay | Admitting: *Deleted

## 2014-10-24 ENCOUNTER — Encounter: Payer: Self-pay | Admitting: Neurology

## 2014-10-24 ENCOUNTER — Ambulatory Visit (INDEPENDENT_AMBULATORY_CARE_PROVIDER_SITE_OTHER): Payer: BLUE CROSS/BLUE SHIELD | Admitting: Neurology

## 2014-10-24 VITALS — BP 124/84 | HR 76 | Ht 63.0 in | Wt 291.0 lb

## 2014-10-24 DIAGNOSIS — G4452 New daily persistent headache (NDPH): Secondary | ICD-10-CM | POA: Diagnosis not present

## 2014-10-24 MED ORDER — PREDNISONE 10 MG PO TABS: ORAL_TABLET | ORAL | Status: DC

## 2014-10-24 MED ORDER — TOPIRAMATE 25 MG PO TABS: 25.0000 mg | ORAL_TABLET | Freq: Two times a day (BID) | ORAL | Status: DC

## 2014-10-24 NOTE — Patient Instructions (Addendum)
1.  We will try to break the headaches with a prednisone taper.  Take 6tabs at once x1day, then 5tabs x1day, then 4tabs x1day, then 3tabs x1day, then 2tabs x1day, then 1tab x1day, then STOP 2.  To try and break frequency of headaches, will start topamax 25mg  at bedtime.  Possible side effects include: impaired thinking, sedation, paresthesias (numbness and tingling) and weight loss.  It may cause dehydration and there is a small risk for kidney stones, so make sure to stay hydrated with water during the day.  There is also a very small risk for glaucoma, so if you notice any change in your vision while taking this medication, see an ophthalmologist.  There is also a very small risk of possible suicidal ideation, as it the case with all antiepileptic medications.  Pregnancy Guidelines 1. If any medication changes are to be made, they should be completed several months before conception. Lamictal (lamotrigine) and Trileptal (oxcarbazepine) levels, in particular, need to carefully monitored (at least once a month) during pregnancy as drug levels for these two medications tend to decrease by at least 30% during pregnancy. 2. The risk of fetal malformation is increased in women taking seizure medications: However, the risk is lower in Topamax, when compared to older antiepileptic medications.  The risk is approximately 3% compared to 1-2% in the general population. 3. Women with epilepsy planning a pregnancy should take 5 mg/day of folic acid in the preconception period and throughout pregnancy. 4. Vitamin K (20 mg/day) should be used in the last month of pregnancy in women on enzyme-inducing seizure medications (such as Topamax).  Infants should receive 1 mg of vitamin K intramuscularly at birth, to prevent hemorrhagic disease of the newborn. 5. Overbreathing (hyperventilation), sleep deprivation, pain, and emotional stress increase the risk of seizures during labor.  Consider epidural anesthesia early in the  labor. 6. All currently available seizure medications can be taken while breast feeding.  3.  Must limit use of Tylenol to no more than 2 days out of the week. 4.  Will get MRI of brain with and without contrast  [facility]  11/08/14 2:45pm 5.  Will refer to eye doctor Dr. Posey Pronto  Telephone [phone]       EEG Follow up 4 weeks
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
## 2014-10-30 ENCOUNTER — Ambulatory Visit (INDEPENDENT_AMBULATORY_CARE_PROVIDER_SITE_OTHER): Payer: BLUE CROSS/BLUE SHIELD | Admitting: Neurology

## 2014-10-30 ENCOUNTER — Telehealth: Payer: Self-pay | Admitting: *Deleted

## 2014-10-30 DIAGNOSIS — G4452 New daily persistent headache (NDPH): Secondary | ICD-10-CM

## 2014-10-30 NOTE — Telephone Encounter (Signed)
-----   Message from Pieter Partridge, DO sent at 10/30/2014  3:47 PM EDT ----- eeg normal ----- Message -----    From: Pieter Partridge, DO    Sent: 10/30/2014   3:47 PM      To: Pieter Partridge, DO

## 2014-10-30 NOTE — Procedures (Signed)
ELECTROENCEPHALOGRAM REPORT  Date of Study: 10/30/2014  Patient's Name: Monique Duncan MRN: [id number] Date of Birth: 16-Feb-1986  Indication: Staring spells  Medications: Topamax   Technical Summary: This is a multichannel digital EEG recording, using the international 10-20 placement system.  Spike detection software was employed.  Description: The EEG recording is mostly of the drowsy state.  In the most awake segments, the background is symmetric with well-developed posterior dominant rhythm of 9-10 Hz, which is reactive to eye opening and closing.  Diffuse beta activity is seen, with a bilateral frontal preponderance.  During drowsiness, there is some shifting asymmetry.  No focal or generalized epileptiform discharges are seen.  Stage II sleep is seen, with normal and symmetric sleep patterns.  Hyperventilation and photic stimulation were performed, and produced no abnormalities.  ECG revealed normal cardiac rate and rhythm.  Impression: This routine EEG of the mostly drowsy and sleep states is within normal limits.  A normal study does not rule out the possibility of a seizure disorder in this patient.  Mechelle Pates R. Tomi Likens, DO

## 2014-10-30 NOTE — Telephone Encounter (Signed)
Left message  Regarding normal EEG

## 2014-11-08 ENCOUNTER — Ambulatory Visit (HOSPITAL_COMMUNITY)
Admission: RE | Admit: 2014-11-08 | Discharge: 2014-11-08 | Disposition: A | Discharge status: Home / Self Care | Payer: BLUE CROSS/BLUE SHIELD | Source: Ambulatory Visit | Attending: Neurology | Admitting: Neurology

## 2014-11-08 DIAGNOSIS — G4452 New daily persistent headache (NDPH): Secondary | ICD-10-CM | POA: Diagnosis not present

## 2014-11-08 DIAGNOSIS — R930 Abnormal findings on diagnostic imaging of skull and head, not elsewhere classified: Secondary | ICD-10-CM | POA: Diagnosis not present

## 2014-11-08 DIAGNOSIS — H539 Unspecified visual disturbance: Secondary | ICD-10-CM | POA: Insufficient documentation

## 2014-11-08 MED ORDER — GADOBENATE DIMEGLUMINE 529 MG/ML IV SOLN
20.0000 mL | Freq: Once | INTRAVENOUS | Status: AC | PRN
  Administered 2014-11-08: 20 mL via INTRAVENOUS

## 2014-11-21 ENCOUNTER — Ambulatory Visit (INDEPENDENT_AMBULATORY_CARE_PROVIDER_SITE_OTHER): Payer: BLUE CROSS/BLUE SHIELD | Admitting: Neurology

## 2014-11-21 ENCOUNTER — Encounter: Payer: Self-pay | Admitting: Neurology

## 2014-11-21 VITALS — BP 136/90 | HR 80 | Resp 16 | Ht 63.0 in | Wt 293.3 lb

## 2014-11-21 DIAGNOSIS — R251 Tremor, unspecified: Secondary | ICD-10-CM | POA: Diagnosis not present

## 2014-11-21 DIAGNOSIS — R930 Abnormal findings on diagnostic imaging of skull and head, not elsewhere classified: Secondary | ICD-10-CM | POA: Diagnosis not present

## 2014-11-21 DIAGNOSIS — G43009 Migraine without aura, not intractable, without status migrainosus: Secondary | ICD-10-CM | POA: Diagnosis not present

## 2014-11-21 DIAGNOSIS — R9082 White matter disease, unspecified: Secondary | ICD-10-CM

## 2014-11-21 MED ORDER — TOPIRAMATE 50 MG PO TABS: ORAL_TABLET | ORAL | Status: DC
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
## 2014-11-21 NOTE — Patient Instructions (Signed)
1.  Start topamax 50mg  tablet.  Take 1/2 tablet at bedtime for 7 days, then increase to 1 full tablet at bedtime.  Call in 4-5 weeks with update (prior to running out of medication) with update and we can decide if we need to increase dose. 2.  Limit use of Tylenol to no more than 2 days out of the week 3.  I think the MRI finding on the brain is non-specific and may not be clinically relevant.  We will repeat MRI of brain with and without contrast in 6 months to look for any changes. 4.  Monitor tremor for now. 5.  Follow up in 3 months.

## 2014-11-22 ENCOUNTER — Encounter: Payer: Self-pay | Admitting: Neurology

## 2014-12-17 ENCOUNTER — Other Ambulatory Visit: Payer: Self-pay | Admitting: Neurology

## 2015-03-11 ENCOUNTER — Encounter: Payer: Self-pay | Admitting: Neurology

## 2015-03-11 ENCOUNTER — Ambulatory Visit (INDEPENDENT_AMBULATORY_CARE_PROVIDER_SITE_OTHER): Payer: BLUE CROSS/BLUE SHIELD | Admitting: Neurology

## 2015-03-11 VITALS — BP 102/60 | HR 60 | Ht 63.0 in | Wt 303.0 lb

## 2015-03-11 DIAGNOSIS — R930 Abnormal findings on diagnostic imaging of skull and head, not elsewhere classified: Secondary | ICD-10-CM | POA: Diagnosis not present

## 2015-03-11 DIAGNOSIS — G4452 New daily persistent headache (NDPH): Secondary | ICD-10-CM | POA: Insufficient documentation

## 2015-03-11 DIAGNOSIS — G43009 Migraine without aura, not intractable, without status migrainosus: Secondary | ICD-10-CM | POA: Insufficient documentation

## 2015-03-11 DIAGNOSIS — R404 Transient alteration of awareness: Secondary | ICD-10-CM | POA: Diagnosis not present

## 2015-03-11 DIAGNOSIS — IMO0001 Reserved for inherently not codable concepts without codable children: Secondary | ICD-10-CM

## 2015-03-11 DIAGNOSIS — R9082 White matter disease, unspecified: Secondary | ICD-10-CM | POA: Insufficient documentation

## 2015-03-11 NOTE — Progress Notes (Signed)
NEUROLOGY FOLLOW UP OFFICE NOTE  Monique Duncan [id number]  HISTORY OF PRESENT ILLNESS: Monique Duncan is a 29 year old right-handed woman with morbid obesity, prediabetes, chronic cholecystitis and biliary dyskinesis who follows up for migraines and staring spells.    UPDATE: She has improved her sleep schedule, which has helped improve her migraines. Intensity:  5/10 Duration:  30-60 minutes Frequency:  3 to 4 days per month Current abortive medication:  Tylenol (infrequently) Antihypertensive medications:  none Antidepressant medications:  none Anticonvulsant medications:  topiramate 50mg  Vitamins/Herbal/Supplements:  none  HISTORY: She started having headaches in December 2015.  Over the next few months, they gradually became daily.  They start in the front of the head and radiate to the back of the head and neck.  She describes the pain as a non-throbbing "brain freeze" feeling.  It is usually 7/10 but is worse around her period when it is a 10/10.  It is sometimes associated with photophobia and phonophobia.  It is not associated with nausea.  She notes blurred vision once in a while but denies visual obscurations or pulsatile tinnitus.  There is no preceding aura.  They usually last 5 hours but can last as long as all day.  She has been taking Tylenol daily, which mildly helps.  She has also tried Advil, Aleve, Excedrin, and caffeine which are also ineffective.  Laying down in the dark does not help. She denies prior history of migraine.  Her mother has manageable migraines.  She was referred for a formal ophthalmology evaluation, with no significant findings, such as papilledema.    Apparently, she appears to "zone out" several times a day, looking like she is staring off.  She is not responsive when people talk to her during these spells.  She says she can hear them but the speech doesn't make sense to her.  There is no abnormal movements, twitching or incontinence during the  spells.  She says it only occurs every few weeks.  It appears to be triggered when she is upset.  She has no history of seizures but she reports that several years ago, she was stunned by bees and when she fell down, a witness noted some brief jerking.  To evaluate starring spells, an EEG was performed on 10/30/14 was within normal limits.  MRI of the brain with and without contrast was performed 11/08/14, which revealed a single 13mm T2 white matter hyperintensity adjacent to the left lateral ventricle.  She also notes tremor involving the 3rd digit of her right hand.  It occurred twice, each time lasting 2 hours.  She was unable to suppress it.  It can occur at rest.    PAST MEDICAL HISTORY: Past Medical History  Diagnosis Date  . Headache     MEDICATIONS: Current Outpatient Prescriptions on File Prior to Visit  Medication Sig Dispense Refill  . Norgestimate-Ethinyl Estradiol Triphasic (TRI-SPRINTEC) 0.18/0.215/0.25 MG-35 MCG tablet Take 1 tablet by mouth daily.    Marland Kitchen topiramate (TOPAMAX) 50 MG tablet TAKE 0.5TAB AT BEDTIME X7D, THEN 1TAB AT BEDTIME 30 tablet 0   No current facility-administered medications on file prior to visit.    ALLERGIES: Allergies  Allergen Reactions  . Bee Venom Swelling  . 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    FAMILY HISTORY: Family History  Problem Relation Age of Onset  . Depression Mother   . Migraines Mother   . Breast  cancer Maternal Grandmother   . Diabetes Maternal Grandfather   . Leukemia Paternal Grandfather     SOCIAL HISTORY: Social History   Social History  . Marital Status: Single    Spouse Name: N/A  . Number of Children: N/A  . Years of Education: N/A   Occupational History  . Not on file.   Social History Main Topics  . Smoking status: Never Smoker   . Smokeless tobacco: Never Used  . Alcohol Use: No  . Drug Use: No  . Sexual Activity:    Partners:  Male   Other Topics Concern  . Not on file   Social History Narrative    REVIEW OF SYSTEMS: Constitutional: No fevers, chills, or sweats, no generalized fatigue, change in appetite Eyes: No visual changes, double vision, eye pain Ear, nose and throat: No hearing loss, ear pain, nasal congestion, sore throat Cardiovascular: No chest pain, palpitations Respiratory:  No shortness of breath at rest or with exertion, wheezes GastrointestinaI: No nausea, vomiting, diarrhea, abdominal pain, fecal incontinence Genitourinary:  No dysuria, urinary retention or frequency Musculoskeletal:  No neck pain, back pain Integumentary: No rash, pruritus, skin lesions Neurological: as above Psychiatric: No depression, insomnia, anxiety Endocrine: No palpitations, fatigue, diaphoresis, mood swings, change in appetite, change in weight, increased thirst Hematologic/Lymphatic:  No anemia, purpura, petechiae. Allergic/Immunologic: no itchy/runny eyes, nasal congestion, recent allergic reactions, rashes  PHYSICAL EXAM: Filed Vitals:   03/11/15 1337  BP: 102/60  Pulse: 60   General: No acute distress.  Patient appears well-groomed.  Morbidly obese body habitus. Head:  Normocephalic/atraumatic Eyes:  Fundoscopic exam unremarkable without vessel changes, exudates, hemorrhages or papilledema. Neck: supple, no paraspinal tenderness, full range of motion Heart:  Regular rate and rhythm Lungs:  Clear to auscultation bilaterally Back: No paraspinal tenderness Neurological Exam: alert and oriented to person, place, and time. Attention span and concentration intact, recent and remote memory intact, fund of knowledge intact.  Speech fluent and not dysarthric, language intact.  CN II-XII intact. Fundoscopic exam unremarkable without vessel changes, exudates, hemorrhages or papilledema.  Bulk and tone normal, muscle strength 5/5 throughout.  Sensation to light touch, temperature and vibration intact.  Deep tendon  reflexes 2+ throughout, toes downgoing.  Finger to nose and heel to shin testing intact.  Gait normal, Romberg negative.  IMPRESSION: Migraine without aura, improved Staring spells.  I don't suspect they are epileptic.  May be stress-related. Abnormal white matter on MRI of brain  PLAN: 1.  Continue topiramate 50mg  at bedtime 2.  Discussed to continue working on lifestyle modification, including weight loss 3.  Follow up MRI of brain scheduled for October.  Will follow up with me soon afterwards to discuss results.  15 minutes spent face to face with patient, over 50% spent discussing management.  Metta Clines, DO  CC:  Carlos Levering

## 2015-03-11 NOTE — Patient Instructions (Signed)
Migraine Recommendations: 1.  Continue topiramate 50mg  at bedtime 2.  Take Tylenol if needed. 3.  Limit use of pain relievers to no more than 2 days out of the week.  These medications include acetaminophen, ibuprofen, triptans and narcotics.  This will help reduce risk of rebound headaches. 4.  Be aware of common food triggers such as processed sweets, processed foods with nitrites (such as deli meat, hot dogs, sausages), foods with MSG, alcohol (such as wine), chocolate, certain cheeses, certain fruits (dried fruits, some citrus fruit), vinegar, diet soda. 4.  Avoid caffeine 5.  Routine exercise 6.  Proper sleep hygiene 7.  Stay adequately hydrated with water 8.  Keep a headache diary. 9.  Maintain proper stress management. 10.  Do not skip meals. 11.  Follow up MRI of brain in October with follow up with me afterwards.

## 2015-05-09 ENCOUNTER — Ambulatory Visit: Payer: Self-pay | Admitting: Surgery

## 2015-05-09 NOTE — H&P (Signed)
  History of Present Illness Monique Duncan. Hilliary Jock MD; 05/09/2015 3:24 PM) Patient words: umbilical drainage.  The patient is a 29 year old female who presents with an unspecified infection. s/p laparoscopic cholecystectomy by Dr. Rosendo Gros on 04/11/14. Post-operatively, she has had several episodes of infection at her umbilicus. She actually required hospitalization in December for this problem. She now has chronic purulent drainage coming from the depths of her umbilicus. Sometimes there is blood in the drainage. currently she is on antibiotics. She comes to Urgent Office for evaluation. Problem List/Past Medical Maia Petties, MD; 92/33/0076 2:26 PM) UMBILICAL DISCHARGE (J33.5)  Allergies (Ammie Eversole, LPN; 45/62/5638 9:37 PM) Vancomycin HCl *ANTI-INFECTIVE AGENTS - MISC.*  Medication History (Ammie Eversole, LPN; 34/28/7681 1:57 PM) Topiramate (50MG  Tablet, Oral) Active. Tri-Previfem (0.18/0.215/0.25MG -35 MCG Tablet, Oral) Active. Medications Reconciled     Physical Exam Rodman Key K. Ahmar Pickrell MD; 05/09/2015 3:27 PM)  The physical exam findings are as follows: Note:WDWN in NAD HEENT: EOMI, sclera anicteric Neck: No masses, no thyromegaly Lungs: CTA bilaterally; normal respiratory effort CV: Regular rate and rhythm; no murmurs Abd: +bowel sounds, obese; soft; chronic purulent drainage coming from the umbilicus The umbilicus is mildly tender We explored the very deep umbilicus with cotton swabs. There seems to be a small opening at the base of the umbilicus that is draining purulent fluid. No surrounding erythema or induration. Ext: Well-perfused; no edema Skin: Warm, dry; no sign of jaundice    Assessment & Plan Rodman Key K. Wallace Cogliano MD; 26/20/3559 7:41 PM)  UMBILICAL DISCHARGE (U38.4)  Current Plans Schedule for Surgery - Umbilectomy. The surgical procedure has been discussed with the patient. Potential risks, benefits, alternative treatments, and expected outcomes have  been explained. All of the patient's questions at this time have been answered. The likelihood of reaching the patient's treatment goal is good. The patient understand the proposed surgical procedure and wishes to proceed.  Monique Duncan. Georgette Dover, MD, [facility]  General/ Trauma Surgery  05/09/2015 3:27 PM

## 2015-05-16 ENCOUNTER — Ambulatory Visit (HOSPITAL_COMMUNITY): Admission: RE | Admit: 2015-05-16 | Payer: BLUE CROSS/BLUE SHIELD | Source: Ambulatory Visit

## 2015-05-28 ENCOUNTER — Other Ambulatory Visit: Payer: Self-pay | Admitting: Surgery

## 2015-05-31 ENCOUNTER — Encounter (HOSPITAL_COMMUNITY): Payer: Self-pay | Admitting: Oncology

## 2015-05-31 ENCOUNTER — Emergency Department (HOSPITAL_COMMUNITY)
Admission: EM | Admit: 2015-05-31 | Discharge: 2015-06-01 | Disposition: A | Discharge status: Home / Self Care | Payer: BC Managed Care – PPO | Attending: Emergency Medicine | Admitting: Emergency Medicine

## 2015-05-31 DIAGNOSIS — T814XXA Infection following a procedure, initial encounter: Secondary | ICD-10-CM | POA: Insufficient documentation

## 2015-05-31 DIAGNOSIS — L03311 Cellulitis of abdominal wall: Secondary | ICD-10-CM

## 2015-05-31 DIAGNOSIS — Y658 Other specified misadventures during surgical and medical care: Secondary | ICD-10-CM | POA: Insufficient documentation

## 2015-05-31 DIAGNOSIS — T8140XA Infection following a procedure, unspecified, initial encounter: Secondary | ICD-10-CM

## 2015-05-31 DIAGNOSIS — Z9889 Other specified postprocedural states: Secondary | ICD-10-CM | POA: Diagnosis not present

## 2015-05-31 HISTORY — DX: Cellulitis, unspecified: L03.90

## 2015-05-31 NOTE — ED Notes (Signed)
Pt had abdominal surgery/removal of umbilicus on Tuesday.  Today pt noticed that the surgical site was warm and red.  Pt rates pain 3/10 w/ intermittent sharp pain.  Pt w/ hx of recurrent cellulitis.

## 2015-06-01 ENCOUNTER — Telehealth: Payer: Self-pay | Admitting: General Surgery

## 2015-06-01 ENCOUNTER — Emergency Department (HOSPITAL_COMMUNITY): Payer: BC Managed Care – PPO

## 2015-06-01 LAB — CBC WITH DIFFERENTIAL/PLATELET
Basophils Absolute: 0 10*3/uL (ref 0.0–0.1)
Basophils Relative: 0 %
Eosinophils Absolute: 0.7 10*3/uL (ref 0.0–0.7)
Eosinophils Relative: 8 %
HCT: 40.4 % (ref 36.0–46.0)
Hemoglobin: 13.3 g/dL (ref 12.0–15.0)
Lymphocytes Relative: 27 %
Lymphs Abs: 2.4 10*3/uL (ref 0.7–4.0)
MCH: 27.9 pg (ref 26.0–34.0)
MCHC: 32.9 g/dL (ref 30.0–36.0)
MCV: 84.9 fL (ref 78.0–100.0)
Monocytes Absolute: 0.7 10*3/uL (ref 0.1–1.0)
Monocytes Relative: 7 %
Neutro Abs: 5.2 10*3/uL (ref 1.7–7.7)
Neutrophils Relative %: 58 %
Platelets: 257 10*3/uL (ref 150–400)
RBC: 4.76 MIL/uL (ref 3.87–5.11)
RDW: 13.6 % (ref 11.5–15.5)
WBC: 9 10*3/uL (ref 4.0–10.5)

## 2015-06-01 LAB — COMPREHENSIVE METABOLIC PANEL
ALT: 56 U/L — ABNORMAL HIGH (ref 14–54)
AST: 50 U/L — ABNORMAL HIGH (ref 15–41)
Albumin: 4.1 g/dL (ref 3.5–5.0)
Alkaline Phosphatase: 85 U/L (ref 38–126)
Anion gap: 8 (ref 5–15)
BUN: 12 mg/dL (ref 6–20)
CO2: 27 mmol/L (ref 22–32)
Calcium: 9.3 mg/dL (ref 8.9–10.3)
Chloride: 104 mmol/L (ref 101–111)
Creatinine, Ser: 0.67 mg/dL (ref 0.44–1.00)
GFR calc Af Amer: >60 mL/min (ref >60)
GFR calc non Af Amer: >60 mL/min (ref >60)
Glucose, Bld: 99 mg/dL (ref 65–99)
Potassium: 3.8 mmol/L (ref 3.5–5.1)
Sodium: 139 mmol/L (ref 135–145)
Total Bilirubin: 0.5 mg/dL (ref 0.3–1.2)
Total Protein: 8.1 g/dL (ref 6.5–8.1)

## 2015-06-01 LAB — I-STAT CG4 LACTIC ACID, ED: Lactic Acid, Venous: 1.1 mmol/L (ref 0.5–2.0)

## 2015-06-01 MED ORDER — CEPHALEXIN 500 MG PO CAPS: 500.0000 mg | ORAL_CAPSULE | Freq: Four times a day (QID) | ORAL | Status: DC

## 2015-06-01 MED ORDER — MORPHINE SULFATE (PF) 4 MG/ML IV SOLN
4.0000 mg | Freq: Once | INTRAVENOUS | Status: AC
  Administered 2015-06-01: 4 mg via INTRAVENOUS
  Filled 2015-06-01: qty 1

## 2015-06-01 MED ORDER — HYDROMORPHONE HCL 1 MG/ML IJ SOLN
0.5000 mg | Freq: Once | INTRAMUSCULAR | Status: AC
  Administered 2015-06-01: 0.5 mg via INTRAVENOUS
  Filled 2015-06-01: qty 1

## 2015-06-01 MED ORDER — SODIUM CHLORIDE 0.9 % IV BOLUS (SEPSIS)
1000.0000 mL | Freq: Once | INTRAVENOUS | Status: AC
  Administered 2015-06-01: 1000 mL via INTRAVENOUS

## 2015-06-01 MED ORDER — CEFAZOLIN SODIUM-DEXTROSE 2-3 GM-% IV SOLR
2.0000 g | Freq: Three times a day (TID) | INTRAVENOUS | Status: DC
  Filled 2015-06-01: qty 50

## 2015-06-01 MED ORDER — IOHEXOL 300 MG/ML  SOLN
50.0000 mL | Freq: Once | INTRAMUSCULAR | Status: AC | PRN
Start: 2015-06-01 — End: 2015-06-01
  Administered 2015-06-01: 50 mL via ORAL

## 2015-06-01 MED ORDER — MORPHINE SULFATE (PF) 4 MG/ML IV SOLN: 4.0000 mg | Freq: Once | INTRAVENOUS | Status: DC

## 2015-06-01 MED ORDER — MORPHINE SULFATE (PF) 4 MG/ML IV SOLN
4.0000 mg | Freq: Once | INTRAVENOUS | Status: DC
  Filled 2015-06-01: qty 1

## 2015-06-01 MED ORDER — ONDANSETRON HCL 4 MG/2ML IJ SOLN
4.0000 mg | Freq: Once | INTRAMUSCULAR | Status: AC
  Administered 2015-06-01: 4 mg via INTRAVENOUS
  Filled 2015-06-01: qty 2

## 2015-06-01 MED ORDER — DEXTROSE 5 % IV SOLN
3.0000 g | INTRAVENOUS | Status: AC
  Administered 2015-06-01: 3 g via INTRAVENOUS
  Filled 2015-06-01: qty 3000

## 2015-06-01 MED ORDER — AMOXICILLIN-POT CLAVULANATE 875-125 MG PO TABS: 1.0000 | ORAL_TABLET | Freq: Two times a day (BID) | ORAL | Status: DC

## 2015-06-01 MED ORDER — IOHEXOL 300 MG/ML  SOLN
100.0000 mL | Freq: Once | INTRAMUSCULAR | Status: AC | PRN
  Administered 2015-06-01: 100 mL via INTRAVENOUS

## 2015-06-01 MED ORDER — CEFAZOLIN SODIUM-DEXTROSE 2-3 GM-% IV SOLR
2.0000 g | INTRAVENOUS | Status: DC
  Filled 2015-06-01: qty 50

## 2015-06-01 MED ORDER — CLINDAMYCIN PHOSPHATE 600 MG/50ML IV SOLN
600.0000 mg | Freq: Once | INTRAVENOUS | Status: AC
  Administered 2015-06-01: 600 mg via INTRAVENOUS
  Filled 2015-06-01: qty 50

## 2015-06-01 NOTE — ED Notes (Signed)
Awake. Verbally responsive. A/O x4. Resp even and unlabored. No audible adventitious breath sounds noted. ABC's intact.  

## 2015-06-01 NOTE — ED Provider Notes (Signed)
Patient's a 29 year old female, recent umbilical surgery, now developed some pinkness and redness to the skin, slightly warm to the touch, no induration, no palpable abscess, no drainage from the surgical site.  The area has been outlined, the abdomen is overall nontender except for the area of erythema and again there is no induration.  Surgical team consult at, recommended Keflex per Dr. Marcello Moores, patient to be discharged to follow-up within 48 hours for wound check. The patient expresses her understanding.  Medical screening examination/treatment/procedure(s) were conducted as a shared visit with non-physician practitioner(s) and myself.  I personally evaluated the patient during the encounter.  Clinical Impression:   Final diagnoses:  Post op infection         Monique Chapel, MD 06/01/15 [phone]

## 2015-06-01 NOTE — ED Notes (Signed)
Patient transported to CT 

## 2015-06-01 NOTE — ED Notes (Signed)
Awake. Verbally responsive. Resp even and unlabored. No audible adventitious breath sounds noted. ABC's intact. Abd soft/nondistended but tender to palpate. BS (+) and active x4 quadrants. No N/V/D reported. 

## 2015-06-01 NOTE — Telephone Encounter (Signed)
Was called by the emergency department PA to clarify instructions given by Dr. Johney Maine. I recommended that they open the bottom portion of the wound to allow the fluid collection to drain out and place the patient on Keflex. I recommended that patient follow up in the office in 3 to 4 days.

## 2015-06-01 NOTE — Progress Notes (Addendum)
ANTIBIOTIC CONSULT NOTE - INITIAL  Pharmacy Consult for Adjustment of Cefazolin Indication: Bacteremia  Allergies  Allergen Reactions  . Bee Venom Swelling  . 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    Patient Measurements: Height: 5\' 3"  (160 cm) Weight: (!) 304 lb (137.893 kg) IBW/kg (Calculated) : 52.4  Vital Signs: Temp: 98.6 F (37 C) (11/05 0601) Temp Source: Oral (11/05 0601) BP: 131/49 mmHg (11/05 0601) Pulse Rate: 70 (11/05 0601) Intake/Output from previous day:   Intake/Output from this shift:    Labs:  Recent Labs  06/01/15 0025  WBC 9.0  HGB 13.3  PLT 257  CREATININE 0.67   Estimated Creatinine Clearance: 143.1 mL/min (by C-G formula based on Cr of 0.67). No results for input(s): VANCOTROUGH, VANCOPEAK, VANCORANDOM, GENTTROUGH, GENTPEAK, GENTRANDOM, TOBRATROUGH, TOBRAPEAK, TOBRARND, AMIKACINPEAK, AMIKACINTROU, AMIKACIN in the last 72 hours.   Microbiology: No results found for this or any previous visit (from the past 720 hour(s)).  Medical History: Past Medical History  Diagnosis Date  . Headache   . Cellulitis     Assessment:  29 yr female s/p umbilectomy 3 days ago presents with redness and pain around incision site.  Noted h/o infections following cholecystectomy Sept 2015.  Surgery consulted.  Cefazolin 3gm IV q8h ordered with request that pharmacy may adjust dosing strength, schedule, rate of infusion, etc as needed to optimize therapy   Goal of Therapy:  Eradication of infection  Plan:   Adjust Cefazolin to 3gm IV x 1 followed by 2gm IV q8h per recommendations for treatment of bacteremia  No further adjustment needed to Cefazolin therefore will sign off   Willian Donson, Toribio Harbour, PharmD 06/01/2015,6:31 AM

## 2015-06-01 NOTE — Discharge Instructions (Signed)
WOUND CARE  It is important that the wound be kept open.   -Keeping the skin edges apart will allow the wound to gradually heal from the base upwards.   - If the skin edges of the wound close too early, a new fluid pocket can form and infection can occur. -This is the reason to pack deeper wounds with gauze or ribbon -This is why drained wounds cannot be sewed closed right away  A healthy wound should form a lining of bright red "beefy" granulating tissue that will help shrink the wound and help the edges grow new skin into it.   -A little mucus / yellow discharge is normal (the body's natural way to try and form a scab) and should be gently washed off with soap and water with daily dressing changes.  -Green or foul smelling drainage implies bacterial colonization and can slow wound healing - a short course of antibiotic ointment (3-5 days) can help it clear up.  Call the doctor if it does not improve or worsens  -Avoid use of antibiotic ointments for more than a week as they can slow wound healing over time.    -Sometimes other wound care products will be used to reduce need for dressing changes and/or help clean up dirty wounds -Sometimes the surgeon needs to debride the wound in the office to remove dead or infected tissue out of the wound so it can heal more quickly and safely.    Change the dressing at least once a day -Wash the wound with mild soap and water gently every day.  It is good to shower or bathe the wound to help it clean out. -Use clean 4x4 gauze for medium/large wounds or ribbon plain NU-gauze for smaller wounds (it does not need to be sterile, just clean) -Keep the raw wound moist with a little saline or KY (saline) gel on the gauze.  -A dry wound will take longer to heal.  -Keep the skin dry around the wound to prevent breakdown and irritation. -Pack the wound down to the base -The goal is to keep the skin apart, not overpack the wound -Use a Q-tip or blunt-tipped kabob  stick toothpick to push the gauze down to the base in narrow or deep wounds   -Cover with a clean gauze and tape -paper or Medipore tape tend to be gentle on the skin -rotate the orientation of the tape to avoid repeated stress/trauma on the skin -using an ACE or Coban wrap on wounds on arms or legs can be used instead.  Complete all antibiotics through the entire prescription to help the infection heal and prevent new places of infection   Returning the see the surgeon is helpful to follow the healing process and help the wound close as fast as possible.  Cellulitis Cellulitis is an infection of the skin and the tissue beneath it. The infected area is usually red and tender. Cellulitis occurs most often in the arms and lower legs.  CAUSES  Cellulitis is caused by bacteria that enter the skin through cracks or cuts in the skin. The most common types of bacteria that cause cellulitis are staphylococci and streptococci. SIGNS AND SYMPTOMS   Redness and warmth.  Swelling.  Tenderness or pain.  Fever. DIAGNOSIS  Your health care provider can usually determine what is wrong based on a physical exam. Blood tests may also be done. TREATMENT  Treatment usually involves taking an antibiotic medicine. HOME CARE INSTRUCTIONS   Take your antibiotic medicine  as directed by your health care provider. Finish the antibiotic even if you start to feel better.  Keep the infected arm or leg elevated to reduce swelling.  Apply a warm cloth to the affected area up to 4 times per day to relieve pain.  Take medicines only as directed by your health care provider.  Keep all follow-up visits as directed by your health care provider. SEEK MEDICAL CARE IF:   You notice red streaks coming from the infected area.  Your red area gets larger or turns dark in color.  Your bone or joint underneath the infected area becomes painful after the skin has healed.  Your infection returns in the same area or  another area.  You notice a swollen bump in the infected area.  You develop new symptoms.  You have a fever. SEEK IMMEDIATE MEDICAL CARE IF:   You feel very sleepy.  You develop vomiting or diarrhea.  You have a general ill feeling (malaise) with muscle aches and pains.   This information is not intended to replace advice given to you by your health care provider. Make sure you discuss any questions you have with your health care provider.   Document Released: 04/22/2005 Document Revised: 04/03/2015 Document Reviewed: 09/28/2011 Elsevier Interactive Patient Education 2016 [place].  Managing Pain  Pain after surgery or related to activity is often due to strain/injury to muscle, tendon, nerves and/or incisions.  This pain is usually short-term and will improve in a few months.   Many people find it helpful to do the following things TOGETHER to help speed the process of healing and to get back to regular activity more quickly:  1. Avoid heavy physical activity at first a. No lifting greater than 20 pounds at first, then increase to lifting as tolerated over the next few weeks b. Do not push through the pain.  Listen to your body and avoid positions and maneuvers than reproduce the pain.  Wait a few days before trying something more intense c. Walking is okay as tolerated, but go slowly and stop when getting sore.  If you can walk 30 minutes without stopping or pain, you can try more intense activity (running, jogging, aerobics, cycling, swimming, treadmill, sex, sports, weightlifting, etc ) d. Remember: If it hurts to do it, then dont do it!  2. Take Anti-inflammatory medication a. Choose ONE of the following over-the-counter medications: i.            Acetaminophen 500mg  tabs (Tylenol) 1-2 pills with every meal and just before bedtime (avoid if you have liver problems) ii.            Naproxen 220mg  tabs (ex. Aleve) 1-2 pills twice a day (avoid if you have kidney, stomach,  IBD, or bleeding problems) iii. Ibuprofen 200mg  tabs (ex. Advil, Motrin) 3-4 pills with every meal and just before bedtime (avoid if you have kidney, stomach, IBD, or bleeding problems) b. Take with food/snack around the clock for 1-2 weeks i. This helps the muscle and nerve tissues become less irritable and calm down faster  3. Use a Heating pad or Ice/Cold Pack a. 4-6 times a day b. May use warm bath/hottub  or showers  4. Try Gentle Massage and/or Stretching  a. at the area of pain many times a day b. stop if you feel pain - do not overdo it  Try these steps together to help you body heal faster and avoid making things get worse.  Doing just one of  these things may not be enough.    If you are not getting better after two weeks or are noticing you are getting worse, contact our office for further advice; we may need to re-evaluate you & see what other things we can do to help.

## 2015-06-01 NOTE — Progress Notes (Signed)
Monique Duncan  01/21/1986 [id number]  Patient Care Team: Carlos Levering, PA-C as PCP - General (Family Medicine)  This patient is a 29 y.o.female who calls today for surgical evaluation.   Date of procedure/visit: 05/28/2015  Surgery: Umbilectomy  Reason for call: Redness at incision  Called by patient's mother.  Concern for increasing redness at the incision that was significant "like the size of a cane lobe.  I recommended evaluation emergency room.  Call by 62 assistant.  Moderate erythema noted.  CT scan done showed 4 x 3 cm fluid collection.  I recommended drainage with wick placement to allow fluid to drain.  I recommended IV antibiotics.  Recommended oral antibiotics and close follow-up in office.  Patient Active Problem List   Diagnosis Date Noted  . New daily persistent headache 03/11/2015  . Migraine without aura and without status migrainosus, not intractable 03/11/2015  . White matter abnormality on MRI of brain 03/11/2015  . Red man syndrome 07/17/2014  . Cellulitis and abscess of trunk 07/17/2014  . Abdominal pain 04/10/2014  . Biliary dyskinesia 04/10/2014  . Uterine fibroid 04/10/2014  . Morbid obesity ([place]) 04/10/2014    Past Medical History  Diagnosis Date  . Headache   . Cellulitis     Past Surgical History  Procedure Laterality Date  . Tonsillectomy    . Myringotomy with tube placement    . Cholecystectomy N/A 04/11/2014    Procedure: LAPAROSCOPIC CHOLECYSTECTOMY;  Surgeon: Ralene Ok, MD;  Location: [facility];  Service: General;  Laterality: N/A;    Social History   Social History  . Marital Status: Single    Spouse Name: N/A  . Number of Children: N/A  . Years of Education: N/A   Occupational History  . Not on file.   Social History Main Topics  . Smoking status: Never Smoker   . Smokeless tobacco: Never Used  . Alcohol Use: No  . Drug Use: No  . Sexual Activity:    Partners: Male   Other Topics Concern  . Not on file    Social History Narrative    Family History  Problem Relation Age of Onset  . Depression Mother   . Migraines Mother   . Breast cancer Maternal Grandmother   . Diabetes Maternal Grandfather   . Leukemia Paternal Grandfather     Current Facility-Administered Medications  Medication Dose Route Frequency Provider Last Rate Last Dose  . ceFAZolin (ANCEF) 3 g in dextrose 5 % 50 mL IVPB  3 g Intravenous 3 times per day Michael Boston, MD      . morphine 4 MG/ML injection 4 mg  4 mg Intravenous Once Delsa Grana, PA-C   4 mg at 06/01/15 6384   Current Outpatient Prescriptions  Medication Sig Dispense Refill  . HYDROcodone-acetaminophen (NORCO/VICODIN) 5-325 MG tablet TAKE 1 TABLET BY MOUTH EVERY 4 TO 6 HOURS AS NEEDED FOR PAIN  0  . Norgestimate-Ethinyl Estradiol Triphasic (TRI-SPRINTEC) 0.18/0.215/0.25 MG-35 MCG tablet Take 1 tablet by mouth daily.    Marland Kitchen topiramate (TOPAMAX) 50 MG tablet Take 50 mg by mouth daily.    Marland Kitchen amoxicillin-clavulanate (AUGMENTIN) 875-125 MG tablet Take 1 tablet by mouth 2 (two) times daily. 14 tablet 1  . topiramate (TOPAMAX) 50 MG tablet TAKE 0.5TAB AT BEDTIME X7D, THEN 1TAB AT BEDTIME (Patient not taking: Reported on 06/01/2015) 30 tablet 0     Allergies  Allergen Reactions  . Bee Venom Swelling  . Vancomycin Anxiety and Rash    Red Man's syndrome which  resulted in patient having a panic reaction as well.    BP 131/49 mmHg  Pulse 70  Temp(Src) 98.6 F (37 C) (Oral)  Resp 16  Ht 5\' 3"  (1.6 m)  Wt 137.893 kg (304 lb)  BMI 53.86 kg/m2  SpO2 97%  LMP 05/31/2015 (Exact Date)  Ct Abdomen Pelvis W Contrast  06/01/2015  CLINICAL DATA:  Status post abdominal surgery/umbilectomy 4 days ago. Redness and pain at surgical site. History of cellulitis. EXAM: CT ABDOMEN AND PELVIS WITH CONTRAST TECHNIQUE: Multidetector CT imaging of the abdomen and pelvis was performed using the standard protocol following bolus administration of intravenous contrast. CONTRAST:  172mL  OMNIPAQUE IOHEXOL 300 MG/ML  SOLN COMPARISON:  CT abdomen and pelvis July 17, 2014 FINDINGS: LUNG BASES: Multiple sub solid pulmonary nodules within the bilateral lower lobes is present on prior examination. Additional patchy consolidation LEFT lower lobe. Included heart size is normal, no pericardial fluid collections. Mild gaseous distended distal esophagus can be seen with reflux. SOLID ORGANS: The liver, spleen, scratch pancreas and adrenal glands are unremarkable. Status post cholecystectomy. GASTROINTESTINAL TRACT: The stomach, small and large bowel are normal in course and caliber without inflammatory changes. Normal appendix. KIDNEYS/ URINARY TRACT: Kidneys are orthotopic, demonstrating symmetric enhancement. No nephrolithiasis, hydronephrosis or solid renal masses. The unopacified ureters are normal in course and caliber. Urinary bladder is partially distended and unremarkable. PERITONEUM/RETROPERITONEUM: Aortoiliac vessels are normal in course and caliber. No lymphadenopathy by CT size criteria. Dense 9 by 7 cm and 9.1 x 9.4 cm masses in the central pelvis, previous described as fibroids. No intraperitoneal free fluid nor free air. SOFT TISSUE/OSSEOUS STRUCTURES: Status post umbilectomy, 4 x 4.1 cm fluid collection present in the surgical bed, superficial to the abdominal fascia, 15 mm from the skin surface. Collection contained contains a few bubbles of gas. Small fat containing umbilical hernia. No radiopaque foreign bodies. IMPRESSION: Interval umbilectomy; 4 x 4.1 cm fluid collection within the surgical bed, 15 mm from the skin surface which could represent hematoma or abscess. Probable fibroid uterus, recommend follow-up pelvic ultrasound on a nonemergent basis to exclude ovarian masses/endometriomas. Electronically Signed   By: Elon Alas M.D.   On: 06/01/2015 05:46    Note: This dictation was prepared with Dragon/digital dictation along with [employer]. Any transcriptional  errors that result from this process are unintentional.

## 2015-06-01 NOTE — ED Provider Notes (Signed)
Received patient handoff report received from Delsa Grana, PA-C Pt has history of lap chole last year with history of recurrent infections. Pt had a recent umbilectomy and is POD 3 from this procedure. Presenting with pain, drainage, warmth, and redness surrounding the surgical site. She originally spoke with Dr. Johney Maine, on call general surgeon, who ordered 3g Ancef and told her to open the surgical wound and perform an I&D.  Patient's surgeon from this procedure is Dr. Georgette Dover, of [facility].  Physical Exam  BP 123/79 mmHg  Pulse 72  Temp(Src) 98.6 F (37 C) (Oral)  Resp 18  Ht 5\' 3"  (1.6 m)  Wt 304 lb (137.893 kg)  BMI 53.86 kg/m2  SpO2 94%  LMP 05/31/2015 (Exact Date)  Physical Exam  Constitutional: She appears well-developed and well-nourished. No distress.  HENT:  Head: Normocephalic and atraumatic.  Eyes: Conjunctivae are normal. Pupils are equal, round, and reactive to light.  Cardiovascular: Normal rate, regular rhythm and normal heart sounds.   Pulmonary/Chest: Effort normal and breath sounds normal. No respiratory distress.  Abdominal: Soft. Bowel sounds are normal.  Area of erythema, tenderness, and increased warmth surrounding surgical site and extending downward toward pelvis, about the size of a volleyball. No drainage from wound. Edges appear well approximated without dehiscence. Qtip unable to be passed into the wound.   Musculoskeletal: She exhibits no edema or tenderness.  Neurological: She is alert.  Skin: Skin is warm and dry. She is not diaphoretic.  Nursing note and vitals reviewed.   ED Course  Procedures  MDM Marella Vanderpol presents with post op surgical site infection with surrounding cellulitis. Pt is POD 3.   Findings and plan of care discussed with April Palumbo, MD and then with Noemi Chapel, MD after EDP shift change.   Will consult with day time surgeon on call and ask them to come down to perform any procedure that needs to be done.   9:01 AM Spoke with Dr. Marcello Moores from surgery. Dr. Marcello Moores states that she does not think she needs to see the patient and that Dr. Johney Maine has already made some recommendations. Advised for [place] to push a qtip into the incision and check for drainage, discharge pt on keflex, and have her follow up at the surgical office next week. Pt voices understanding of this plan and is comfortable with discharge.    Lorayne Bender, PA-C 06/01/15 0911  Noemi Chapel, MD 06/02/15 [phone]

## 2015-06-01 NOTE — ED Provider Notes (Signed)
CSN: [id number]     Arrival date & time 05/31/15  2200 History   First MD Initiated Contact with Patient 06/01/15 0056     Chief Complaint  Patient presents with  . Cellulitis     (Consider location/radiation/quality/duration/timing/severity/associated sxs/prior Treatment) HPI   Patient is a 29 year old female with history of migraines, and multiple infections s/p cholecystectomy September 2015, she presents emergency department for evaluation of redness, swelling and pain surrounding her abdominal surgical site on postop day 3. She has had no drainage from her surgical site and Steri-Strips are still in place. Today she had sudden increase in pain and new redness surrounding her incision.  She reports subjective fevers and nausea for the past 2 days.  She denies V, D, or any other complaints at this time.  Past Medical History  Diagnosis Date  . Headache   . Cellulitis    Past Surgical History  Procedure Laterality Date  . Tonsillectomy    . Myringotomy with tube placement    . Cholecystectomy N/A 04/11/2014    Procedure: LAPAROSCOPIC CHOLECYSTECTOMY;  Surgeon: Ralene Ok, MD;  Location: [facility];  Service: General;  Laterality: N/A;   Family History  Problem Relation Age of Onset  . Depression Mother   . Migraines Mother   . Breast cancer Maternal Grandmother   . Diabetes Maternal Grandfather   . Leukemia Paternal Grandfather    Social History  Substance Use Topics  . Smoking status: Never Smoker   . Smokeless tobacco: Never Used  . Alcohol Use: No   OB History    No data available     Review of Systems  Constitutional: Positive for fever, chills and diaphoresis. Negative for activity change, appetite change and fatigue.  HENT: Negative.   Respiratory: Negative.  Negative for chest tightness and shortness of breath.   Cardiovascular: Negative.   Gastrointestinal: Positive for nausea and abdominal pain. Negative for vomiting, diarrhea, constipation and blood in  stool.  Genitourinary: Negative.   Musculoskeletal: Negative.   Skin: Positive for color change. Negative for pallor and rash.  Neurological: Negative.   Psychiatric/Behavioral: Negative.       Allergies  Bee venom and Vancomycin  Home Medications   Prior to Admission medications   Medication Sig Start Date End Date Taking? Authorizing Provider  HYDROcodone-acetaminophen (NORCO/VICODIN) 5-325 MG tablet TAKE 1 TABLET BY MOUTH EVERY 4 TO 6 HOURS AS NEEDED FOR PAIN 05/28/15  Yes Historical Provider, MD  Norgestimate-Ethinyl Estradiol Triphasic (TRI-SPRINTEC) 0.18/0.215/0.25 MG-35 MCG tablet Take 1 tablet by mouth daily.   Yes Historical Provider, MD  topiramate (TOPAMAX) 50 MG tablet Take 50 mg by mouth daily.   Yes Historical Provider, MD  topiramate (TOPAMAX) 50 MG tablet TAKE 0.5TAB AT BEDTIME X7D, THEN 1TAB AT BEDTIME Patient not taking: Reported on 06/01/2015 12/17/14   Pieter Partridge, DO   BP 156/77 mmHg  Pulse 82  Temp(Src) 98.8 F (37.1 C) (Oral)  Resp 20  Ht 5\' 3"  (1.6 m)  Wt 304 lb (137.893 kg)  BMI 53.86 kg/m2  SpO2 98%  LMP 05/31/2015 (Exact Date) Physical Exam  Constitutional: She is oriented to person, place, and time. She appears well-developed and well-nourished. No distress.  HENT:  Head: Normocephalic and atraumatic.  Nose: Nose normal.  Mouth/Throat: Oropharynx is clear and moist. No oropharyngeal exudate.  Eyes: Conjunctivae and EOM are normal. Pupils are equal, round, and reactive to light. Right eye exhibits no discharge. Left eye exhibits no discharge. No scleral icterus.  Neck: Normal  range of motion. No JVD present. No tracheal deviation present. No thyromegaly present.  Cardiovascular: Normal rate, regular rhythm, normal heart sounds and intact distal pulses.  Exam reveals no gallop and no friction rub.   No murmur heard. Pulmonary/Chest: Effort normal and breath sounds normal. No respiratory distress. She has no wheezes. She has no rales. She exhibits no  tenderness.  Abdominal: Soft. Bowel sounds are normal. She exhibits no distension and no mass. There is tenderness. There is no rebound and no guarding.  Abdomen obese, soft, non-distended, midline abdominal incision clean, dry, intact with steristrips present.  Surrounding erythema with warmth and induration, ttp.  Musculoskeletal: Normal range of motion. She exhibits no edema or tenderness.  Lymphadenopathy:    She has no cervical adenopathy.  Neurological: She is alert and oriented to person, place, and time. She has normal reflexes. No cranial nerve deficit. She exhibits normal muscle tone. Coordination normal.  Skin: Skin is warm and dry. No rash noted. She is not diaphoretic. There is erythema. No pallor.  Psychiatric: She has a normal mood and affect. Her behavior is normal. Judgment and thought content normal.  Nursing note and vitals reviewed.      ED Course  Procedures (including critical care time) Labs Review Labs Reviewed  COMPREHENSIVE METABOLIC PANEL - Abnormal; Notable for the following:    AST 50 (*)    ALT 56 (*)    All other components within normal limits  CULTURE, BLOOD (ROUTINE X 2)  CULTURE, BLOOD (ROUTINE X 2)  CBC WITH DIFFERENTIAL/PLATELET  I-STAT CG4 LACTIC ACID, ED    Imaging Review   CT Abdomen Pelvis W Contrast (Final result) Result time: 06/01/15 05:46:24   Final result by Rad Results In Interface (06/01/15 05:46:24)   Narrative:   CLINICAL DATA: Status post abdominal surgery/umbilectomy 4 days ago. Redness and pain at surgical site. History of cellulitis.  EXAM: CT ABDOMEN AND PELVIS WITH CONTRAST  TECHNIQUE: Multidetector CT imaging of the abdomen and pelvis was performed using the standard protocol following bolus administration of intravenous contrast.  CONTRAST: 12mL OMNIPAQUE IOHEXOL 300 MG/ML SOLN  COMPARISON: CT abdomen and pelvis July 17, 2014  FINDINGS: LUNG BASES: Multiple sub solid pulmonary nodules within  the bilateral lower lobes is present on prior examination. Additional patchy consolidation LEFT lower lobe. Included heart size is normal, no pericardial fluid collections. Mild gaseous distended distal esophagus can be seen with reflux.  SOLID ORGANS: The liver, spleen, scratch pancreas and adrenal glands are unremarkable. Status post cholecystectomy.  GASTROINTESTINAL TRACT: The stomach, small and large bowel are normal in course and caliber without inflammatory changes. Normal appendix.  KIDNEYS/ URINARY TRACT: Kidneys are orthotopic, demonstrating symmetric enhancement. No nephrolithiasis, hydronephrosis or solid renal masses. The unopacified ureters are normal in course and caliber. Urinary bladder is partially distended and unremarkable.  PERITONEUM/RETROPERITONEUM: Aortoiliac vessels are normal in course and caliber. No lymphadenopathy by CT size criteria. Dense 9 by 7 cm and 9.1 x 9.4 cm masses in the central pelvis, previous described as fibroids. No intraperitoneal free fluid nor free air.  SOFT TISSUE/OSSEOUS STRUCTURES: Status post umbilectomy, 4 x 4.1 cm fluid collection present in the surgical bed, superficial to the abdominal fascia, 15 mm from the skin surface. Collection contained contains a few bubbles of gas. Small fat containing umbilical hernia. No radiopaque foreign bodies.  IMPRESSION: Interval umbilectomy; 4 x 4.1 cm fluid collection within the surgical bed, 15 mm from the skin surface which could represent hematoma or abscess.  Probable  fibroid uterus, recommend follow-up pelvic ultrasound on a nonemergent basis to exclude ovarian masses/endometriomas.   Electronically Signed By: Elon Alas M.D. On: 06/01/2015 05:46     No results found. I have personally reviewed and evaluated these images and lab results as part of my medical decision-making.   EKG Interpretation None      MDM   Final diagnoses:  None    Pt POD #3 s/p  umbilectomy, after multiple recurrent infections that began after lap chole 03/2014 Basic labs obtained, no leukocytosis, lactic acid negative, CT abd/pelvis show a 4 x 4 centimeter fluid collection present in the surgical bed superficial to abdominal fascia but partially 1.5 centimeters from the surface with surrounding erythema, induration and warmth  Surgical consult was called, spoke with Dr. Johney Maine, who advised removing Steri-Strips, draining pus out and placing a wick with a follow-up in the office in a few days and a course of Augmentin 875 outpatient.  He also ordered 3 g of Ancef.  This directive was reviewed with my attending physician, who advised waiting for next available surgical consult, as she felt this was a post-op complication, more appropriate to be evaluated by surgery before intervention.    The pt was handed off to the oncoming PA at approximately 7am. Pt was stable, in NAD, VSS at the time of handoff to [facility], PA-C.   Delsa Grana, PA-C 06/17/15 [id number]  April Palumbo, MD 06/18/15 [phone]

## 2015-06-01 NOTE — ED Notes (Signed)
Pt reported cellulitis to abd sx site noting last night. Area with redness, warmth to touch and painful to touch. Currently waiting on surgery team. Pt denies fever, nausea without vomiting. Pt reported having diarrhea x5 in past 24hrs.

## 2015-06-02 ENCOUNTER — Emergency Department (HOSPITAL_COMMUNITY)
Admission: EM | Admit: 2015-06-02 | Discharge: 2015-06-02 | Disposition: A | Discharge status: Home / Self Care | Payer: BC Managed Care – PPO | Attending: Emergency Medicine | Admitting: Emergency Medicine

## 2015-06-02 ENCOUNTER — Encounter (HOSPITAL_COMMUNITY): Payer: Self-pay | Admitting: Emergency Medicine

## 2015-06-02 DIAGNOSIS — Z79899 Other long term (current) drug therapy: Secondary | ICD-10-CM | POA: Insufficient documentation

## 2015-06-02 DIAGNOSIS — Z9889 Other specified postprocedural states: Secondary | ICD-10-CM | POA: Diagnosis not present

## 2015-06-02 DIAGNOSIS — L03311 Cellulitis of abdominal wall: Secondary | ICD-10-CM

## 2015-06-02 DIAGNOSIS — Z79818 Long term (current) use of other agents affecting estrogen receptors and estrogen levels: Secondary | ICD-10-CM | POA: Insufficient documentation

## 2015-06-02 DIAGNOSIS — Z792 Long term (current) use of antibiotics: Secondary | ICD-10-CM | POA: Diagnosis not present

## 2015-06-02 DIAGNOSIS — G8918 Other acute postprocedural pain: Secondary | ICD-10-CM | POA: Diagnosis present

## 2015-06-02 MED ORDER — LIDOCAINE-EPINEPHRINE 1 %-1:100000 IJ SOLN
10.0000 mL | Freq: Once | INTRAMUSCULAR | Status: AC
  Administered 2015-06-02: 10 mL

## 2015-06-02 MED ORDER — DOXYCYCLINE HYCLATE 100 MG PO TABS
100.0000 mg | ORAL_TABLET | Freq: Once | ORAL | Status: AC
  Administered 2015-06-02: 100 mg via ORAL
  Filled 2015-06-02: qty 1

## 2015-06-02 MED ORDER — DOXYCYCLINE HYCLATE 100 MG PO CAPS: 100.0000 mg | ORAL_CAPSULE | Freq: Two times a day (BID) | ORAL | Status: DC

## 2015-06-02 NOTE — ED Provider Notes (Signed)
CSN: [id number]     Arrival date & time 06/02/15  1619 History   First MD Initiated Contact with Patient 06/02/15 1717     Chief Complaint  Patient presents with  . Post-op Problem     (Consider location/radiation/quality/duration/timing/severity/associated sxs/prior Treatment) HPI   29yF with abdominal pain, redness, swelling. Recent umbilectomy. Seen in ED yesterday for same. Had CT which showed abdominal wall collection, possible abscess. Provider spoke with on-call surgeon who recommended trying to open at bedside, placed on antibiotics and have follow-up in surgical office. Apparently unable to easily open incision with probing. Patient reports initial improvement of erythema and pain. This progressively began worsening again shortly before arrival.  Past Medical History  Diagnosis Date  . Headache   . Cellulitis    Past Surgical History  Procedure Laterality Date  . Tonsillectomy    . Myringotomy with tube placement    . Cholecystectomy N/A 04/11/2014    Procedure: LAPAROSCOPIC CHOLECYSTECTOMY;  Surgeon: Ralene Ok, MD;  Location: [facility];  Service: General;  Laterality: N/A;   Family History  Problem Relation Age of Onset  . Depression Mother   . Migraines Mother   . Breast cancer Maternal Grandmother   . Diabetes Maternal Grandfather   . Leukemia Paternal Grandfather    Social History  Substance Use Topics  . Smoking status: Never Smoker   . Smokeless tobacco: Never Used  . Alcohol Use: No   OB History    No data available     Review of Systems  All systems reviewed and negative, other than as noted in HPI.   Allergies  Bee venom and Vancomycin  Home Medications   Prior to Admission medications   Medication Sig Start Date End Date Taking? Authorizing Provider  amoxicillin-clavulanate (AUGMENTIN) 875-125 MG tablet Take 1 tablet by mouth 2 (two) times daily. 06/01/15   Michael Boston, MD  cephALEXin (KEFLEX) 500 MG capsule Take 1 capsule (500 mg total)  by mouth 4 (four) times daily. 06/01/15   Shawn C Joy, PA-C  clindamycin (CLEOCIN) 300 MG capsule Take 300 mg by mouth every 6 (six) hours. 7 day course filled 05/07/15 05/07/15   Historical Provider, MD  HYDROcodone-acetaminophen (NORCO/VICODIN) 5-325 MG tablet TAKE 1 TABLET BY MOUTH EVERY 4 TO 6 HOURS AS NEEDED FOR PAIN 05/28/15   Historical Provider, MD  Norgestimate-Ethinyl Estradiol Triphasic (TRI-SPRINTEC) 0.18/0.215/0.25 MG-35 MCG tablet Take 1 tablet by mouth daily.    Historical Provider, MD  topiramate (TOPAMAX) 50 MG tablet TAKE 0.5TAB AT BEDTIME X7D, THEN 1TAB AT BEDTIME Patient not taking: Reported on 06/01/2015 12/17/14   Pieter Partridge, DO  topiramate (TOPAMAX) 50 MG tablet Take 50 mg by mouth daily.    Historical Provider, MD   BP 160/99 mmHg  Pulse 87  Temp(Src) 98.5 F (36.9 C) (Oral)  Resp 16  Ht 5\' 3"  (1.6 m)  Wt 307 lb (139.254 kg)  BMI 54.40 kg/m2  SpO2 97%  LMP 05/31/2015 (Exact Date) Physical Exam  Constitutional: She appears well-developed and well-nourished. No distress.  HENT:  Head: Normocephalic and atraumatic.  Eyes: Conjunctivae are normal. Right eye exhibits no discharge. Left eye exhibits no discharge.  Neck: Neck supple.  Cardiovascular: Normal rate, regular rhythm and normal heart sounds.  Exam reveals no gallop and no friction rub.   No murmur heard. Pulmonary/Chest: Effort normal and breath sounds normal. No respiratory distress.  Abdominal: Soft. She exhibits no distension. There is no tenderness.  Intact midline surgical incision. Surrounding skin changes  consistent with cellulitis. Within previously marked borders and appears mildly improved from prior pictures.   Musculoskeletal: She exhibits no edema or tenderness.  Neurological: She is alert.  Skin: Skin is warm and dry.  Psychiatric: She has a normal mood and affect. Her behavior is normal. Thought content normal.  Nursing note and vitals reviewed.   ED Course  Procedures (including critical  care time) Labs Review Labs Reviewed - No data to display  Imaging Review Ct Abdomen Pelvis W Contrast  06/01/2015  CLINICAL DATA:  Status post abdominal surgery/umbilectomy 4 days ago. Redness and pain at surgical site. History of cellulitis. EXAM: CT ABDOMEN AND PELVIS WITH CONTRAST TECHNIQUE: Multidetector CT imaging of the abdomen and pelvis was performed using the standard protocol following bolus administration of intravenous contrast. CONTRAST:  115mL OMNIPAQUE IOHEXOL 300 MG/ML  SOLN COMPARISON:  CT abdomen and pelvis July 17, 2014 FINDINGS: LUNG BASES: Multiple sub solid pulmonary nodules within the bilateral lower lobes is present on prior examination. Additional patchy consolidation LEFT lower lobe. Included heart size is normal, no pericardial fluid collections. Mild gaseous distended distal esophagus can be seen with reflux. SOLID ORGANS: The liver, spleen, scratch pancreas and adrenal glands are unremarkable. Status post cholecystectomy. GASTROINTESTINAL TRACT: The stomach, small and large bowel are normal in course and caliber without inflammatory changes. Normal appendix. KIDNEYS/ URINARY TRACT: Kidneys are orthotopic, demonstrating symmetric enhancement. No nephrolithiasis, hydronephrosis or solid renal masses. The unopacified ureters are normal in course and caliber. Urinary bladder is partially distended and unremarkable. PERITONEUM/RETROPERITONEUM: Aortoiliac vessels are normal in course and caliber. No lymphadenopathy by CT size criteria. Dense 9 by 7 cm and 9.1 x 9.4 cm masses in the central pelvis, previous described as fibroids. No intraperitoneal free fluid nor free air. SOFT TISSUE/OSSEOUS STRUCTURES: Status post umbilectomy, 4 x 4.1 cm fluid collection present in the surgical bed, superficial to the abdominal fascia, 15 mm from the skin surface. Collection contained contains a few bubbles of gas. Small fat containing umbilical hernia. No radiopaque foreign bodies. IMPRESSION:  Interval umbilectomy; 4 x 4.1 cm fluid collection within the surgical bed, 15 mm from the skin surface which could represent hematoma or abscess. Probable fibroid uterus, recommend follow-up pelvic ultrasound on a nonemergent basis to exclude ovarian masses/endometriomas. Electronically Signed   By: Elon Alas M.D.   On: 06/01/2015 05:46   I have personally reviewed and evaluated these images and lab results as part of my medical decision-making.   EKG Interpretation None      MDM   Final diagnoses:  Abdominal wall cellulitis    29 year old female with abdominal wall collection with overlying cellulitis after recent surgery. Patient was just seen in the emergency room for the same. Phone consultation with surgery. Recommended trying to open incision to drain, place on abx on follow-up in surgery office. Per notes, attempted to open wound with q-tip unsuccessfully. Initial improvement of symptoms and now worsening again. On my exam incision well intact. Did not open easily with probing.   Seen by Dr Ninfa Linden. Opened at bedside. Appreciate his assistance. Abx changed to doxycycline per his request. Dose given in ED prior to discharge. Pt advised to stop previously prescribed abx. Follow-up with surgery as recommended.     Virgel Manifold, MD 06/14/15 [phone]

## 2015-06-02 NOTE — Consult Note (Signed)
  This is a pleasant patient who had excision of her umbilicus by Dr. Georgette Dover approximately 5 or 6 days ago. She presented to the emergency department Friday night. A CAT scan showed a large fluid collection underneath the incision. She had cellulitis of the abdominal wall and was placed on Augmentin. She now presents with worsening erythema. An attempt by the emergency room physician to slightly open the wound on Friday night was unsuccessful.  After discussing this with her, I prepped the wound with Betadine, anesthetized with lidocaine, and opened up the lower portion of the incision with the scalpel. I entered a large collection of fluid which contained seroma and hematoma which I evacuated. I then packed it with gauze.  Plan will be to switch her to doxycycline. Wound care instructions were given. She will do wet-to-dry dressing changes 1-2 times a day.  She will keep her postoperative appointment in the office. She will call the office for worsening erythema.

## 2015-06-02 NOTE — ED Notes (Signed)
Pt states she had abd surgery on Tuesday of last week.  Seen in [facility] ED on Friday for cellulitis to abd.  States redness has spread outside of line they drew and pain has increased.

## 2015-06-02 NOTE — Discharge Instructions (Signed)
Get in the shower tomorrow and get the current bandage soaking wet and then remove it completely including the packing.  Wash the wound with soap and water.  Then, repack the wound with a wet guaze using a q-tip one to two times daily.  Keep your regular post op appointment with Dr. Georgette Dover.  Call the office for any further reddness.   To Whom it may concern: Please excuse Ms. Iyer from work tomorrow and the next few days to recover. Thank you.  Nedra Hai, MD

## 2015-06-06 LAB — CULTURE, BLOOD (ROUTINE X 2)
Culture: NO GROWTH
Culture: NO GROWTH

## 2015-06-12 ENCOUNTER — Telehealth: Payer: Self-pay

## 2015-06-12 NOTE — Telephone Encounter (Signed)
Pt called. Had two surgeries w/ in 1 week. MRI was missed, need to reschedule. Pt was given [facility] scheduling number. Order was replaced.

## 2015-06-14 ENCOUNTER — Ambulatory Visit: Payer: BLUE CROSS/BLUE SHIELD | Admitting: Neurology

## 2015-10-28 ENCOUNTER — Other Ambulatory Visit: Payer: Self-pay | Admitting: Family Medicine

## 2015-10-28 ENCOUNTER — Ambulatory Visit
Admission: RE | Admit: 2015-10-28 | Discharge: 2015-10-28 | Disposition: A | Discharge status: Home / Self Care | Payer: BC Managed Care – PPO | Source: Ambulatory Visit | Attending: Family Medicine | Admitting: Family Medicine

## 2015-10-28 DIAGNOSIS — S93401A Sprain of unspecified ligament of right ankle, initial encounter: Secondary | ICD-10-CM

## 2016-03-15 ENCOUNTER — Emergency Department (HOSPITAL_COMMUNITY)
Admission: EM | Admit: 2016-03-15 | Discharge: 2016-03-15 | Disposition: A | Discharge status: Home / Self Care | Payer: BC Managed Care – PPO | Attending: Emergency Medicine | Admitting: Emergency Medicine

## 2016-03-15 ENCOUNTER — Emergency Department (HOSPITAL_COMMUNITY): Payer: BC Managed Care – PPO

## 2016-03-15 ENCOUNTER — Encounter (HOSPITAL_COMMUNITY): Payer: Self-pay | Admitting: Emergency Medicine

## 2016-03-15 DIAGNOSIS — Z79899 Other long term (current) drug therapy: Secondary | ICD-10-CM | POA: Insufficient documentation

## 2016-03-15 DIAGNOSIS — R102 Pelvic and perineal pain: Secondary | ICD-10-CM | POA: Diagnosis not present

## 2016-03-15 DIAGNOSIS — R1031 Right lower quadrant pain: Secondary | ICD-10-CM | POA: Diagnosis present

## 2016-03-15 DIAGNOSIS — R509 Fever, unspecified: Secondary | ICD-10-CM | POA: Diagnosis not present

## 2016-03-15 DIAGNOSIS — D259 Leiomyoma of uterus, unspecified: Secondary | ICD-10-CM | POA: Diagnosis not present

## 2016-03-15 DIAGNOSIS — R1030 Lower abdominal pain, unspecified: Secondary | ICD-10-CM

## 2016-03-15 LAB — COMPREHENSIVE METABOLIC PANEL
ALT: 37 U/L (ref 14–54)
AST: 31 U/L (ref 15–41)
Albumin: 3.9 g/dL (ref 3.5–5.0)
Alkaline Phosphatase: 59 U/L (ref 38–126)
Anion gap: 8 (ref 5–15)
BUN: 9 mg/dL (ref 6–20)
CO2: 22 mmol/L (ref 22–32)
Calcium: 9 mg/dL (ref 8.9–10.3)
Chloride: 103 mmol/L (ref 101–111)
Creatinine, Ser: 0.71 mg/dL (ref 0.44–1.00)
GFR calc Af Amer: >60 mL/min (ref >60)
GFR calc non Af Amer: >60 mL/min (ref >60)
Glucose, Bld: 100 mg/dL — ABNORMAL HIGH (ref 65–99)
Potassium: 3.9 mmol/L (ref 3.5–5.1)
Sodium: 133 mmol/L — ABNORMAL LOW (ref 135–145)
Total Bilirubin: 1.7 mg/dL — ABNORMAL HIGH (ref 0.3–1.2)
Total Protein: 7.3 g/dL (ref 6.5–8.1)

## 2016-03-15 LAB — URINALYSIS, ROUTINE W REFLEX MICROSCOPIC
Bilirubin Urine: NEGATIVE
Glucose, UA: NEGATIVE mg/dL
Hgb urine dipstick: NEGATIVE
Ketones, ur: NEGATIVE mg/dL
Nitrite: NEGATIVE
Protein, ur: NEGATIVE mg/dL
Specific Gravity, Urine: 1.019 (ref 1.005–1.030)
pH: 7.5 (ref 5.0–8.0)

## 2016-03-15 LAB — URINE MICROSCOPIC-ADD ON

## 2016-03-15 LAB — CBC
HCT: 42.7 % (ref 36.0–46.0)
Hemoglobin: 13.7 g/dL (ref 12.0–15.0)
MCH: 28.2 pg (ref 26.0–34.0)
MCHC: 32.1 g/dL (ref 30.0–36.0)
MCV: 88 fL (ref 78.0–100.0)
Platelets: 236 10*3/uL (ref 150–400)
RBC: 4.85 MIL/uL (ref 3.87–5.11)
RDW: 14.5 % (ref 11.5–15.5)
WBC: 10.1 10*3/uL (ref 4.0–10.5)

## 2016-03-15 LAB — I-STAT BETA HCG BLOOD, ED (MC, WL, AP ONLY): I-stat hCG, quantitative: <5 m[IU]/mL (ref <5)

## 2016-03-15 LAB — LIPASE, BLOOD: Lipase: 22 U/L (ref 11–51)

## 2016-03-15 MED ORDER — HYDROMORPHONE HCL 1 MG/ML IJ SOLN
1.0000 mg | Freq: Once | INTRAMUSCULAR | Status: AC
  Administered 2016-03-15: 1 mg via INTRAVENOUS
  Filled 2016-03-15: qty 1

## 2016-03-15 MED ORDER — ONDANSETRON HCL 4 MG PO TABS: 4.0000 mg | ORAL_TABLET | Freq: Four times a day (QID) | ORAL | 0 refills | Status: DC

## 2016-03-15 MED ORDER — OXYCODONE-ACETAMINOPHEN 5-325 MG PO TABS: 1.0000 | ORAL_TABLET | Freq: Four times a day (QID) | ORAL | 0 refills | Status: DC | PRN

## 2016-03-15 MED ORDER — IOPAMIDOL (ISOVUE-300) INJECTION 61%
INTRAVENOUS | Status: AC
Start: 2016-03-15 — End: 2016-03-15
  Administered 2016-03-15: 100 mL
  Filled 2016-03-15: qty 100

## 2016-03-15 MED ORDER — ONDANSETRON HCL 4 MG/2ML IJ SOLN
4.0000 mg | Freq: Once | INTRAMUSCULAR | Status: AC
  Administered 2016-03-15: 4 mg via INTRAVENOUS
  Filled 2016-03-15: qty 2

## 2016-03-15 NOTE — ED Notes (Signed)
Patient transported to CT 

## 2016-03-15 NOTE — ED Notes (Signed)
Patient transported to [facility] 

## 2016-03-15 NOTE — ED Provider Notes (Signed)
[place] DEPT Provider Note   CSN: [id number] Arrival date & time: 03/15/16  1223     History   Chief Complaint Chief Complaint  Patient presents with  . Abdominal Pain  . Fever    HPI Monique Duncan is a 30 y.o. female.  Patient presents today with a chief complaint of fever and RLQ abdominal pain onset last evening.  She states that the pain has been constant since last evening and is gradually worsening.  She also developed a fever of 101 last evening.  She is also complaining of a headache, congestion, and body aches.  She also has associated nausea, but denies vomiting.  She denies urinary symptoms, vaginal discharge, diarrhea, constipation, cough, sore throat, SOB, chest pain, or any other symptoms.  She has taken OTC pain medications without relief.  Nothing makes the abdominal pain better or worse.  Past surgical history significant for Cholecystectomy in September 2015.        Past Medical History:  Diagnosis Date  . Cellulitis   . Headache     Patient Active Problem List   Diagnosis Date Noted  . New daily persistent headache 03/11/2015  . Migraine without aura and without status migrainosus, not intractable 03/11/2015  . White matter abnormality on MRI of brain 03/11/2015  . Red man syndrome 07/17/2014  . Cellulitis and abscess of trunk 07/17/2014  . Abdominal pain 04/10/2014  . Biliary dyskinesia 04/10/2014  . Uterine fibroid 04/10/2014  . Morbid obesity ([place]) 04/10/2014    Past Surgical History:  Procedure Laterality Date  . CHOLECYSTECTOMY N/A 04/11/2014   Procedure: LAPAROSCOPIC CHOLECYSTECTOMY;  Surgeon: Ralene Ok, MD;  Location: [facility];  Service: General;  Laterality: N/A;  . MYRINGOTOMY WITH TUBE PLACEMENT    . naval removal    . TONSILLECTOMY      OB History    No data available       Home Medications    Prior to Admission medications   Medication Sig Start Date End Date Taking? Authorizing Provider  acetaminophen (TYLENOL)  500 MG tablet Take 1,500-2,000 mg by mouth every 6 (six) hours as needed for moderate pain.   Yes Historical Provider, MD  amoxicillin-clavulanate (AUGMENTIN) 875-125 MG tablet Take 1 tablet by mouth 2 (two) times daily. Patient not taking: Reported on 03/15/2016 06/01/15   Michael Boston, MD  doxycycline (VIBRAMYCIN) 100 MG capsule Take 1 capsule (100 mg total) by mouth 2 (two) times daily. Patient not taking: Reported on 03/15/2016 06/02/15   Virgel Manifold, MD  topiramate (TOPAMAX) 50 MG tablet TAKE 0.5TAB AT BEDTIME X7D, THEN 1TAB AT BEDTIME Patient not taking: Reported on 03/15/2016 12/17/14   Pieter Partridge, DO    Family History Family History  Problem Relation Age of Onset  . Depression Mother   . Migraines Mother   . Breast cancer Maternal Grandmother   . Diabetes Maternal Grandfather   . Leukemia Paternal Grandfather     Social History Social History  Substance Use Topics  . Smoking status: Never Smoker  . Smokeless tobacco: Never Used  . Alcohol use No     Allergies   Bee venom and Vancomycin   Review of Systems Review of Systems  All other systems reviewed and are negative.    Physical Exam Updated Vital Signs BP 146/88 (BP Location: Left Arm)   Pulse 90   Temp 100.7 F (38.2 C) (Oral)   Resp 19   Ht 5\' 3"  (1.6 m)   Wt (!) 150.7 kg  LMP 03/03/2016   SpO2 96%   BMI 58.87 kg/m   Physical Exam  Constitutional: She appears well-developed and well-nourished.  Morbidly obese  HENT:  Head: Normocephalic and atraumatic.  Mouth/Throat: Oropharynx is clear and moist.  Eyes: EOM are normal. Pupils are equal, round, and reactive to light.  Neck: Normal range of motion. Neck supple.  No nuchal rigidity  Cardiovascular: Normal rate, regular rhythm and normal heart sounds.   Pulmonary/Chest: Effort normal and breath sounds normal.  Abdominal: Soft. Bowel sounds are normal. She exhibits no distension and no mass. There is tenderness in the right lower quadrant and left  lower quadrant. There is no rebound and no guarding. No hernia.  Morbidly obese  Genitourinary:  Genitourinary Comments: Patient declined pelvic exam  Neurological: She is alert. She has normal strength. No cranial nerve deficit or sensory deficit. Coordination normal.  Skin: Skin is warm and dry.  Psychiatric: She has a normal mood and affect.  Nursing note and vitals reviewed.    ED Treatments / Results  Labs (all labs ordered are listed, but only abnormal results are displayed) Labs Reviewed  COMPREHENSIVE METABOLIC PANEL - Abnormal; Notable for the following:       Result Value   Sodium 133 (*)    Glucose, Bld 100 (*)    Total Bilirubin 1.7 (*)    All other components within normal limits  URINALYSIS, ROUTINE W REFLEX MICROSCOPIC (NOT AT [facility]) - Abnormal; Notable for the following:    APPearance CLOUDY (*)    Leukocytes, UA MODERATE (*)    All other components within normal limits  URINE MICROSCOPIC-ADD ON - Abnormal; Notable for the following:    Squamous Epithelial / LPF 6-30 (*)    Bacteria, UA MANY (*)    All other components within normal limits  URINE CULTURE  LIPASE, BLOOD  CBC  I-STAT BETA HCG BLOOD, ED ([facility], [facility], [facility] ONLY)    EKG  EKG Interpretation None       Radiology No results found.  Procedures Procedures (including critical care time)  Medications Ordered in ED Medications  iopamidol (ISOVUE-300) 61 % injection (not administered)  HYDROmorphone (DILAUDID) injection 1 mg (1 mg Intravenous Given 03/15/16 1545)  ondansetron (ZOFRAN) injection 4 mg (4 mg Intravenous Given 03/15/16 1545)     Initial Impression / Assessment and Plan / ED Course  I have reviewed the triage vital signs and the nursing notes.  Pertinent labs & imaging results that were available during my care of the patient were reviewed by me and considered in my medical decision making (see chart for details).  Clinical Course     Final Clinical Impressions(s) / ED Diagnoses    Final diagnoses:  RLQ abdominal pain   Patient presents today with complaint of RLQ abdominal pain and fever onset last evening.  On exam, she has tenderness to palpation of both the RLQ and the LLQ, but appears to be worse on the RLQ.  CT ab/pelvis ordered to rule out an appendicitis, which was negative for Appendicitis, but did show large uterine fibroids.  Pelvic ultrasound then ordered, but ovaries were not able to be visualized.  Patient declined pelvic exam.  Explained to patient that ovaries could not be visualized and that ovarian pathology can not be ruled out.  She still did not want a pelvic exam or any further imaging.  Since the onset of pain was last evening, doubt torsion.  She denies any vaginal discharge and no signs of PID  on CT or ultrasound.  UA appears contaminated.  Urine cultured.  However, since she is not having any urinary symptoms, did not treat for UTI. Marland Kitchen Suspect that the pain may be related to the large uterine fibroids.  Patient instructed to follow up with her Gynecologist and given short course of pain medication.  Stable for discharge.  Return precautions given.  New Prescriptions New Prescriptions   No medications on file     Hyman Bible, PA-C 03/19/16 1454    Virgel Manifold, MD 03/19/16 [phone]

## 2016-03-15 NOTE — ED Notes (Signed)
PA at bedside.

## 2016-03-15 NOTE — ED Triage Notes (Signed)
Pt c/o abdominal pain and fever onset last night. Pt is also experiencing chills.

## 2016-03-16 LAB — URINE CULTURE

## 2016-04-24 ENCOUNTER — Other Ambulatory Visit: Payer: Self-pay | Admitting: Family Medicine

## 2016-04-24 DIAGNOSIS — J209 Acute bronchitis, unspecified: Secondary | ICD-10-CM

## 2016-04-29 ENCOUNTER — Ambulatory Visit
Admission: RE | Admit: 2016-04-29 | Discharge: 2016-04-29 | Disposition: A | Discharge status: Home / Self Care | Payer: BC Managed Care – PPO | Source: Ambulatory Visit | Attending: Family Medicine | Admitting: Family Medicine

## 2016-04-29 DIAGNOSIS — J209 Acute bronchitis, unspecified: Secondary | ICD-10-CM

## 2016-04-29 MED ORDER — IOPAMIDOL (ISOVUE-300) INJECTION 61%
75.0000 mL | Freq: Once | INTRAVENOUS | Status: AC | PRN
  Administered 2016-04-29: 75 mL via INTRAVENOUS

## 2016-08-05 ENCOUNTER — Emergency Department (HOSPITAL_COMMUNITY)
Admission: EM | Admit: 2016-08-05 | Discharge: 2016-08-05 | Disposition: A | Discharge status: Home / Self Care | Payer: BC Managed Care – PPO | Attending: Emergency Medicine | Admitting: Emergency Medicine

## 2016-08-05 ENCOUNTER — Encounter (HOSPITAL_COMMUNITY): Payer: Self-pay | Admitting: Emergency Medicine

## 2016-08-05 ENCOUNTER — Emergency Department (HOSPITAL_COMMUNITY): Payer: BC Managed Care – PPO

## 2016-08-05 DIAGNOSIS — Z79899 Other long term (current) drug therapy: Secondary | ICD-10-CM | POA: Diagnosis not present

## 2016-08-05 DIAGNOSIS — R200 Anesthesia of skin: Secondary | ICD-10-CM

## 2016-08-05 LAB — I-STAT CHEM 8, ED
BUN: 10 mg/dL (ref 6–20)
Calcium, Ion: 1.16 mmol/L (ref 1.15–1.40)
Chloride: 102 mmol/L (ref 101–111)
Creatinine, Ser: 0.6 mg/dL (ref 0.44–1.00)
Glucose, Bld: 96 mg/dL (ref 65–99)
HCT: 41 % (ref 36.0–46.0)
Hemoglobin: 13.9 g/dL (ref 12.0–15.0)
Potassium: 3.8 mmol/L (ref 3.5–5.1)
Sodium: 137 mmol/L (ref 135–145)
TCO2: 26 mmol/L (ref 0–100)

## 2016-08-05 NOTE — ED Triage Notes (Signed)
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

## 2016-08-05 NOTE — ED Provider Notes (Signed)
[place] DEPT Provider Note   CSN: [id number] Arrival date & time: 08/05/16  1856     History   Chief Complaint Chief Complaint  Patient presents with  . Numbness    right side of face    HPI Monique Duncan is a 31 y.o. female.  31 year old female presents with intermittent right facial numbness 1 day. Denies any change in sensation to tongue. Denies any change in taste. Can close her eye appropriately denies any increased tearing. No ataxia or foot drop when she walks. Denies any neck pain with this. States that symptoms last for possibly 1520 minutes and resolve spontaneously. No prior history of same. No persistent headaches. No vomiting or fever. No treatment use prior to arrival. She is currently asymptomatic.      Past Medical History:  Diagnosis Date  . Cellulitis   . Headache     Patient Active Problem List   Diagnosis Date Noted  . New daily persistent headache 03/11/2015  . Migraine without aura and without status migrainosus, not intractable 03/11/2015  . White matter abnormality on MRI of brain 03/11/2015  . Red man syndrome 07/17/2014  . Cellulitis and abscess of trunk 07/17/2014  . Abdominal pain 04/10/2014  . Biliary dyskinesia 04/10/2014  . Uterine fibroid 04/10/2014  . Morbid obesity ([place]) 04/10/2014    Past Surgical History:  Procedure Laterality Date  . CHOLECYSTECTOMY N/A 04/11/2014   Procedure: LAPAROSCOPIC CHOLECYSTECTOMY;  Surgeon: Ralene Ok, MD;  Location: [facility];  Service: General;  Laterality: N/A;  . MYRINGOTOMY WITH TUBE PLACEMENT    . naval removal    . TONSILLECTOMY      OB History    No data available       Home Medications    Prior to Admission medications   Medication Sig Start Date End Date Taking? Authorizing Provider  acetaminophen (TYLENOL) 500 MG tablet Take 1,500-2,000 mg by mouth every 6 (six) hours as needed for moderate pain.    Historical Provider, MD  amoxicillin-clavulanate (AUGMENTIN) 875-125 MG  tablet Take 1 tablet by mouth 2 (two) times daily. Patient not taking: Reported on 03/15/2016 06/01/15   Michael Boston, MD  doxycycline (VIBRAMYCIN) 100 MG capsule Take 1 capsule (100 mg total) by mouth 2 (two) times daily. Patient not taking: Reported on 03/15/2016 06/02/15   Virgel Manifold, MD  ondansetron (ZOFRAN) 4 MG tablet Take 1 tablet (4 mg total) by mouth every 6 (six) hours. 03/15/16   Hyman Bible, PA-C  oxyCODONE-acetaminophen (PERCOCET/ROXICET) 5-325 MG tablet Take 1-2 tablets by mouth every 6 (six) hours as needed for severe pain. 03/15/16   Hyman Bible, PA-C  topiramate (TOPAMAX) 50 MG tablet TAKE 0.5TAB AT BEDTIME X7D, THEN 1TAB AT BEDTIME Patient not taking: Reported on 03/15/2016 12/17/14   Pieter Partridge, DO    Family History Family History  Problem Relation Age of Onset  . Depression Mother   . Migraines Mother   . Breast cancer Maternal Grandmother   . Diabetes Maternal Grandfather   . Leukemia Paternal Grandfather     Social History Social History  Substance Use Topics  . Smoking status: Never Smoker  . Smokeless tobacco: Never Used  . Alcohol use No     Allergies   Bee venom and Vancomycin   Review of Systems Review of Systems  All other systems reviewed and are negative.    Physical Exam Updated Vital Signs BP 158/100 (BP Location: Left Arm)   Pulse 99   Temp 98.8 F (37.1 C) (  Oral)   Resp 18   Ht 5\' 3"  (1.6 m)   Wt (!) 147.9 kg   LMP 07/24/2016   SpO2 99%   BMI 57.75 kg/m   Physical Exam  Constitutional: She is oriented to person, place, and time. She appears well-developed and well-nourished.  Non-toxic appearance. No distress.  HENT:  Head: Normocephalic and atraumatic.  Eyes: Conjunctivae, EOM and lids are normal. Pupils are equal, round, and reactive to light.  Neck: Normal range of motion. Neck supple. No tracheal deviation present. No thyroid mass present.  Cardiovascular: Normal rate, regular rhythm and normal heart sounds.  Exam  reveals no gallop.   No murmur heard. Pulmonary/Chest: Effort normal and breath sounds normal. No stridor. No respiratory distress. She has no decreased breath sounds. She has no wheezes. She has no rhonchi. She has no rales.  Abdominal: Soft. Normal appearance and bowel sounds are normal. She exhibits no distension. There is no tenderness. There is no rebound and no CVA tenderness.  Musculoskeletal: Normal range of motion. She exhibits no edema or tenderness.  Neurological: She is alert and oriented to person, place, and time. She has normal strength. No cranial nerve deficit or sensory deficit. GCS eye subscore is 4. GCS verbal subscore is 5. GCS motor subscore is 6.  Skin: Skin is warm and dry. No abrasion and no rash noted.  Psychiatric: She has a normal mood and affect. Her speech is normal and behavior is normal.  Nursing note and vitals reviewed.    ED Treatments / Results  Labs (all labs ordered are listed, but only abnormal results are displayed) Labs Reviewed - No data to display  EKG  EKG Interpretation None       Radiology No results found.  Procedures Procedures (including critical care time)  Medications Ordered in ED Medications - No data to display   Initial Impression / Assessment and Plan / ED Course  I have reviewed the triage vital signs and the nursing notes.  Pertinent labs & imaging results that were available during my care of the patient were reviewed by me and considered in my medical decision making (see chart for details).  Clinical Course     Head CT and blood work without acute findings. Patient has a nonfocal neurological exam. Do not think that this represents CVA or TIA. Will be given neurology referral  Final Clinical Impressions(s) / ED Diagnoses   Final diagnoses:  None    New Prescriptions New Prescriptions   No medications on file     Lacretia Leigh, MD 08/05/16 2124

## 2016-12-07 ENCOUNTER — Emergency Department (HOSPITAL_COMMUNITY)
Admission: EM | Admit: 2016-12-07 | Discharge: 2016-12-08 | Disposition: A | Discharge status: Home / Self Care | Payer: BC Managed Care – PPO | Attending: Emergency Medicine | Admitting: Emergency Medicine

## 2016-12-07 ENCOUNTER — Encounter (HOSPITAL_COMMUNITY): Payer: Self-pay | Admitting: Emergency Medicine

## 2016-12-07 DIAGNOSIS — D251 Intramural leiomyoma of uterus: Secondary | ICD-10-CM | POA: Diagnosis not present

## 2016-12-07 DIAGNOSIS — R109 Unspecified abdominal pain: Secondary | ICD-10-CM

## 2016-12-07 DIAGNOSIS — Z79899 Other long term (current) drug therapy: Secondary | ICD-10-CM | POA: Diagnosis not present

## 2016-12-07 LAB — COMPREHENSIVE METABOLIC PANEL
ALT: 20 U/L (ref 14–54)
AST: 21 U/L (ref 15–41)
Albumin: 4 g/dL (ref 3.5–5.0)
Alkaline Phosphatase: 52 U/L (ref 38–126)
Anion gap: 10 (ref 5–15)
BUN: 7 mg/dL (ref 6–20)
CO2: 23 mmol/L (ref 22–32)
Calcium: 9.1 mg/dL (ref 8.9–10.3)
Chloride: 106 mmol/L (ref 101–111)
Creatinine, Ser: 0.72 mg/dL (ref 0.44–1.00)
GFR calc Af Amer: >60 mL/min (ref >60)
GFR calc non Af Amer: >60 mL/min (ref >60)
Glucose, Bld: 95 mg/dL (ref 65–99)
Potassium: 3.1 mmol/L — ABNORMAL LOW (ref 3.5–5.1)
Sodium: 139 mmol/L (ref 135–145)
Total Bilirubin: 1.2 mg/dL (ref 0.3–1.2)
Total Protein: 7.4 g/dL (ref 6.5–8.1)

## 2016-12-07 LAB — URINALYSIS, ROUTINE W REFLEX MICROSCOPIC
Bilirubin Urine: NEGATIVE
Glucose, UA: NEGATIVE mg/dL
Ketones, ur: NEGATIVE mg/dL
Nitrite: NEGATIVE
Protein, ur: 30 mg/dL — AB
Specific Gravity, Urine: 1.029 (ref 1.005–1.030)
pH: 5 (ref 5.0–8.0)

## 2016-12-07 LAB — CBC
HCT: 40.8 % (ref 36.0–46.0)
Hemoglobin: 13 g/dL (ref 12.0–15.0)
MCH: 28.1 pg (ref 26.0–34.0)
MCHC: 31.9 g/dL (ref 30.0–36.0)
MCV: 88.3 fL (ref 78.0–100.0)
Platelets: 233 10*3/uL (ref 150–400)
RBC: 4.62 MIL/uL (ref 3.87–5.11)
RDW: 14.7 % (ref 11.5–15.5)
WBC: 9.9 10*3/uL (ref 4.0–10.5)

## 2016-12-07 LAB — LIPASE, BLOOD: Lipase: 20 U/L (ref 11–51)

## 2016-12-07 LAB — PREGNANCY, URINE: Preg Test, Ur: NEGATIVE

## 2016-12-07 MED ORDER — SODIUM CHLORIDE 0.9 % IV BOLUS (SEPSIS)
1000.0000 mL | Freq: Once | INTRAVENOUS | Status: AC
Start: 2016-12-07 — End: 2016-12-07
  Administered 2016-12-07: 1000 mL via INTRAVENOUS

## 2016-12-07 MED ORDER — ONDANSETRON HCL 4 MG/2ML IJ SOLN
4.0000 mg | Freq: Once | INTRAMUSCULAR | Status: AC
  Administered 2016-12-07: 4 mg via INTRAVENOUS
  Filled 2016-12-07: qty 2

## 2016-12-07 MED ORDER — DIPHENHYDRAMINE HCL 50 MG/ML IJ SOLN
25.0000 mg | Freq: Once | INTRAMUSCULAR | Status: AC
  Administered 2016-12-07: 25 mg via INTRAVENOUS
  Filled 2016-12-07: qty 1

## 2016-12-07 MED ORDER — IOPAMIDOL (ISOVUE-300) INJECTION 61%
INTRAVENOUS | Status: AC
  Administered 2016-12-08: 100 mL
  Filled 2016-12-07: qty 100

## 2016-12-07 MED ORDER — POTASSIUM CHLORIDE CRYS ER 20 MEQ PO TBCR
40.0000 meq | EXTENDED_RELEASE_TABLET | Freq: Once | ORAL | Status: AC
  Administered 2016-12-08: 40 meq via ORAL
  Filled 2016-12-07: qty 2

## 2016-12-07 MED ORDER — FENTANYL CITRATE (PF) 100 MCG/2ML IJ SOLN
50.0000 ug | Freq: Once | INTRAMUSCULAR | Status: AC
  Administered 2016-12-07: 50 ug via INTRAVENOUS
  Filled 2016-12-07: qty 2

## 2016-12-07 NOTE — ED Provider Notes (Signed)
[facility] DEPT Provider Note   CSN: [id number] Arrival date & time: 12/07/16  1641     History   Chief Complaint Chief Complaint  Patient presents with  . Flank Pain  . Abdominal Pain    HPI Jaycey Gens is a 31 y.o. female.  Patient reports she has a history of having multiple prior abdominal surgeries related to a cholecystectomy with multiple complications. Over the last couple days, the patient has developed left-sided abdominal pain, nausea, vomiting, diarrhea. Nonbloody nonbilious emesis and nonbloody diarrhea. Also reports that she feels a bulging sensation on the left side of her abdomen. Denies any fevers or chills. Denies any urinary symptoms.   The history is provided by the patient and a parent.  Illness  This is a new problem. The current episode started yesterday. The problem occurs constantly. The problem has not changed since onset.Associated symptoms include abdominal pain. Pertinent negatives include no chest pain and no shortness of breath. She has tried nothing for the symptoms.    Past Medical History:  Diagnosis Date  . Cellulitis   . Headache     Patient Active Problem List   Diagnosis Date Noted  . New daily persistent headache 03/11/2015  . Migraine without aura and without status migrainosus, not intractable 03/11/2015  . White matter abnormality on MRI of brain 03/11/2015  . Red man syndrome 07/17/2014  . Cellulitis and abscess of trunk 07/17/2014  . Abdominal pain 04/10/2014  . Biliary dyskinesia 04/10/2014  . Uterine fibroid 04/10/2014  . Morbid obesity ([place]) 04/10/2014    Past Surgical History:  Procedure Laterality Date  . CHOLECYSTECTOMY N/A 04/11/2014   Procedure: LAPAROSCOPIC CHOLECYSTECTOMY;  Surgeon: Ralene Ok, MD;  Location: [facility] ORS;  Service: General;  Laterality: N/A;  . MYRINGOTOMY WITH TUBE PLACEMENT    . naval removal    . TONSILLECTOMY      OB History    No data available       Home Medications     Prior to Admission medications   Medication Sig Start Date End Date Taking? Authorizing Provider  amoxicillin-clavulanate (AUGMENTIN) 875-125 MG tablet Take 1 tablet by mouth 2 (two) times daily. Patient not taking: Reported on 08/05/2016 06/01/15   Michael Boston, MD  doxycycline (VIBRAMYCIN) 100 MG capsule Take 1 capsule (100 mg total) by mouth 2 (two) times daily. Patient not taking: Reported on 08/05/2016 06/02/15   Virgel Manifold, MD  ondansetron (ZOFRAN) 4 MG tablet Take 1 tablet (4 mg total) by mouth every 6 (six) hours. Patient not taking: Reported on 08/05/2016 03/15/16   Hyman Bible, PA-C  oxyCODONE-acetaminophen (PERCOCET/ROXICET) 5-325 MG tablet Take 1-2 tablets by mouth every 6 (six) hours as needed for severe pain. Patient not taking: Reported on 08/05/2016 03/15/16   Hyman Bible, PA-C  topiramate (TOPAMAX) 50 MG tablet TAKE 0.5TAB AT BEDTIME X7D, THEN 1TAB AT BEDTIME Patient not taking: Reported on 08/05/2016 12/17/14   Pieter Partridge, DO    Family History Family History  Problem Relation Age of Onset  . Depression Mother   . Migraines Mother   . Breast cancer Maternal Grandmother   . Diabetes Maternal Grandfather   . Leukemia Paternal Grandfather     Social History Social History  Substance Use Topics  . Smoking status: Never Smoker  . Smokeless tobacco: Never Used  . Alcohol use No     Allergies   Bee venom; Fentanyl; and Vancomycin   Review of Systems Review of Systems  Constitutional: Negative for chills and  fever.  Respiratory: Negative for shortness of breath.   Cardiovascular: Negative for chest pain.  Gastrointestinal: Positive for abdominal pain. Negative for blood in stool.  Genitourinary: Negative for dysuria, flank pain, vaginal bleeding and vaginal discharge.  All other systems reviewed and are negative.    Physical Exam Updated Vital Signs BP (!) 143/96 (BP Location: Right Arm)   Pulse 65   Temp 98.4 F (36.9 C) (Oral)   Resp 18    LMP 12/06/2016 (Approximate)   SpO2 100%   Physical Exam  Constitutional: She appears well-developed and well-nourished. No distress.  HENT:  Head: Normocephalic and atraumatic.  Eyes: Conjunctivae are normal.  Neck: Neck supple.  Cardiovascular: Normal rate and regular rhythm.   No murmur heard. Pulmonary/Chest: Effort normal and breath sounds normal. No respiratory distress.  Abdominal: Soft. There is generalized tenderness and tenderness in the left upper quadrant and left lower quadrant. There is no rebound, no guarding, no tenderness at McBurney's point and negative Murphy's sign. No hernia.  Obese.  Musculoskeletal: She exhibits no edema.  Neurological: She is alert.  Skin: Skin is warm and dry.  Psychiatric: She has a normal mood and affect.  Nursing note and vitals reviewed.    ED Treatments / Results  Labs (all labs ordered are listed, but only abnormal results are displayed) Labs Reviewed  COMPREHENSIVE METABOLIC PANEL - Abnormal; Notable for the following:       Result Value   Potassium 3.1 (*)    All other components within normal limits  URINALYSIS, ROUTINE W REFLEX MICROSCOPIC - Abnormal; Notable for the following:    APPearance HAZY (*)    Hgb urine dipstick LARGE (*)    Protein, ur 30 (*)    Leukocytes, UA TRACE (*)    Bacteria, UA FEW (*)    Squamous Epithelial / LPF 6-30 (*)    All other components within normal limits  LIPASE, BLOOD  CBC  PREGNANCY, URINE    EKG  EKG Interpretation None       Radiology No results found.  Procedures Procedures (including critical care time)  Medications Ordered in ED Medications  potassium chloride SA (K-DUR,KLOR-CON) CR tablet 40 mEq (not administered)  sodium chloride 0.9 % bolus 1,000 mL (1,000 mLs Intravenous New Bag/Given 12/07/16 2220)  fentaNYL (SUBLIMAZE) injection 50 mcg (50 mcg Intravenous Given 12/07/16 2221)  ondansetron (ZOFRAN) injection 4 mg (4 mg Intravenous Given 12/07/16 2220)   diphenhydrAMINE (BENADRYL) injection 25 mg (25 mg Intravenous Given 12/07/16 2248)     Initial Impression / Assessment and Plan / ED Course  I have reviewed the triage vital signs and the nursing notes.  Pertinent labs & imaging results that were available during my care of the patient were reviewed by me and considered in my medical decision making (see chart for details).     On arrival here, patient is well-appearing in no acute distress. Vital signs are normal and stable. She is significantly tender to the left side of her abdomen. She does have a past medical history of multiple prior abdominal surgeries. In the setting of multiple prior surgeries and multiple episodes of nausea, vomiting, diarrhea today, will get a CAT scan of the patient's abdomen. Lipase without evidence of pancreatitis. Electrolytes show hypokalemia 3.1, we'll replete that here. Otherwise normal. Pregnancy test negative. Urinalysis with question of urinary tract infection, but the sample does appear to be contaminated; the patient does not have any urinary symptoms so we'll hold off on treatment for now.  We'll give the patient IV fluids, antiemetics, pain medications as needed.  CT still pending at time of transfer of care.   Final Clinical Impressions(s) / ED Diagnoses   Final diagnoses:  Left sided abdominal pain    New Prescriptions New Prescriptions   No medications on file     Maryan Puls, MD 12/08/16 1504    Carmin Muskrat, MD 12/08/16 2142

## 2016-12-07 NOTE — ED Triage Notes (Signed)
Pt reports left abdominal pain that began Friday night, pt has bruised, soft, tender area to left abdomen. Pt also reports some right flank pain. Hx of uterine fibroids.

## 2016-12-08 ENCOUNTER — Emergency Department (HOSPITAL_COMMUNITY): Payer: BC Managed Care – PPO

## 2016-12-08 ENCOUNTER — Encounter (HOSPITAL_COMMUNITY): Payer: Self-pay | Admitting: Radiology

## 2016-12-08 MED ORDER — ONDANSETRON HCL 4 MG/2ML IJ SOLN
4.0000 mg | Freq: Once | INTRAMUSCULAR | Status: AC
  Administered 2016-12-08: 4 mg via INTRAVENOUS
  Filled 2016-12-08: qty 2

## 2016-12-08 NOTE — ED Notes (Signed)
Patient left at this time with all belongings. 

## 2016-12-08 NOTE — ED Notes (Signed)
PO challenge given  

## 2016-12-08 NOTE — Discharge Instructions (Signed)

## 2016-12-08 NOTE — ED Notes (Signed)
Wasted fentanyl 6mcg with Minda Ditto, RN.

## 2016-12-08 NOTE — ED Provider Notes (Signed)
I assumed care at signout to f/u on CT imaging CT scans reveals fibroid uterus, possible pelvic congestion syndrome Pt improved No distress noted She is already aware of fibroids and will f/u with GYN    Ripley Fraise, MD 12/08/16 0127

## 2017-10-20 ENCOUNTER — Other Ambulatory Visit: Payer: Self-pay

## 2017-10-20 ENCOUNTER — Encounter (HOSPITAL_COMMUNITY): Payer: Self-pay

## 2017-10-20 ENCOUNTER — Emergency Department (HOSPITAL_COMMUNITY)
Admission: EM | Admit: 2017-10-20 | Discharge: 2017-10-20 | Disposition: A | Discharge status: Home / Self Care | Payer: BC Managed Care – PPO | Attending: Emergency Medicine | Admitting: Emergency Medicine

## 2017-10-20 DIAGNOSIS — M79645 Pain in left finger(s): Secondary | ICD-10-CM | POA: Insufficient documentation

## 2017-10-20 DIAGNOSIS — Z5189 Encounter for other specified aftercare: Secondary | ICD-10-CM

## 2017-10-20 DIAGNOSIS — Z4802 Encounter for removal of sutures: Secondary | ICD-10-CM | POA: Insufficient documentation

## 2017-10-20 MED ORDER — BACITRACIN ZINC 500 UNIT/GM EX OINT: 1.0000 "application " | TOPICAL_OINTMENT | Freq: Once | CUTANEOUS | Status: DC

## 2017-10-20 NOTE — ED Notes (Addendum)
Pt states she is unable to move L thumb after getting stitches about a week ago. States she cannot feel her L thumb when it is touched and it has a "bad smell". Also states that she has been running a fever and taking Tylenol.

## 2017-10-20 NOTE — ED Triage Notes (Signed)
Patient complains of left thumb numbness and limited ROM since stiches placed 8 days ago at [place]. Has not foillowed up with ortho

## 2017-10-20 NOTE — Discharge Instructions (Addendum)
Wash the affected area with soap and water and apply a thin layer of topical antibiotic ointment. Do this every 12 hours.   Do not use rubbing alcohol or hydrogen peroxide.                        Look for signs of infection: if you see redness, if the area becomes warm, if pain increases sharply, there is discharge (pus), if red streaks appear or you develop fever or vomiting, RETURN immediately to the Emergency Department  for a recheck.

## 2017-10-20 NOTE — ED Provider Notes (Signed)
[facility] EMERGENCY DEPARTMENT Provider Note   CSN: [id number] Arrival date & time: 10/20/17  0906     History   Chief Complaint CC: Pain to left thumb   HPI   Blood pressure (!) 146/94, pulse 72, temperature 99 F (37.2 C), temperature source Oral, resp. rate 16, SpO2 100 %.  Monique Duncan is a 32 y.o. female complaining of pain and reduced range of motion to left 8 days ago when she cut with a sharp hand.  She was seen at [facility] in the wound was cleaned and closed, there was about viability of flap per chart review.  Patient has not been washing the wound, she is only been applying Neosporin.  She states that she was advised to do this at [facility] however discharge instructions are clearly stated instructions for cleansing with slow soap and water.  She was advised to follow orthopedics on day 2 for recheck however she did not do so.  She states that she has been compliant with her Augmentin.  She has 1 dose left.  She reports tactile fever and chills at home.  She states that she looked away and her mother who cut the tip of the thumb with a paper clip and she states that she could not feel it.   Past Medical History:  Diagnosis Date  . Cellulitis   . Headache     Patient Active Problem List   Diagnosis Date Noted  . New daily persistent headache 03/11/2015  . Migraine without aura and without status migrainosus, not intractable 03/11/2015  . White matter abnormality on MRI of brain 03/11/2015  . Red man syndrome 07/17/2014  . Cellulitis and abscess of trunk 07/17/2014  . Abdominal pain 04/10/2014  . Biliary dyskinesia 04/10/2014  . Uterine fibroid 04/10/2014  . Morbid obesity ([place]) 04/10/2014    Past Surgical History:  Procedure Laterality Date  . CHOLECYSTECTOMY N/A 04/11/2014   Procedure: LAPAROSCOPIC CHOLECYSTECTOMY;  Surgeon: Ralene Ok, MD;  Location: [facility] ORS;  Service: General;  Laterality: N/A;  . MYRINGOTOMY WITH TUBE  PLACEMENT    . naval removal    . TONSILLECTOMY       OB History   None      Home Medications    Prior to Admission medications   Medication Sig Start Date End Date Taking? Authorizing Provider  amoxicillin-clavulanate (AUGMENTIN) 875-125 MG tablet Take 1 tablet by mouth 2 (two) times daily. Patient not taking: Reported on 08/05/2016 06/01/15   Michael Boston, MD  doxycycline (VIBRAMYCIN) 100 MG capsule Take 1 capsule (100 mg total) by mouth 2 (two) times daily. Patient not taking: Reported on 08/05/2016 06/02/15   Virgel Manifold, MD  ondansetron (ZOFRAN) 4 MG tablet Take 1 tablet (4 mg total) by mouth every 6 (six) hours. Patient not taking: Reported on 08/05/2016 03/15/16   Hyman Bible, PA-C  oxyCODONE-acetaminophen (PERCOCET/ROXICET) 5-325 MG tablet Take 1-2 tablets by mouth every 6 (six) hours as needed for severe pain. Patient not taking: Reported on 08/05/2016 03/15/16   Hyman Bible, PA-C  topiramate (TOPAMAX) 50 MG tablet TAKE 0.5TAB AT BEDTIME X7D, THEN 1TAB AT BEDTIME Patient not taking: Reported on 08/05/2016 12/17/14   Pieter Partridge, DO    Family History Family History  Problem Relation Age of Onset  . Depression Mother   . Migraines Mother   . Breast cancer Maternal Grandmother   . Diabetes Maternal Grandfather   . Leukemia Paternal Grandfather     Social History Social History  Tobacco Use  . Smoking status: Never Smoker  . Smokeless tobacco: Never Used  Substance Use Topics  . Alcohol use: No    Alcohol/week: 0.0 oz  . Drug use: No     Allergies   Bee venom; Fentanyl; and Vancomycin   Review of Systems Review of Systems  A complete review of systems was obtained and all systems are negative except as noted in the HPI and PMH.   Physical Exam Updated Vital Signs BP (!) 133/97 (BP Location: Right Arm)   Pulse 77   Temp 99 F (37.2 C) (Oral)   Resp 18   SpO2 98%   Physical Exam  Constitutional: She is oriented to person, place, and time.  She appears well-developed and well-nourished. No distress.  HENT:  Head: Normocephalic and atraumatic.  Mouth/Throat: Oropharynx is clear and moist.  Eyes: Pupils are equal, round, and reactive to light. Conjunctivae and EOM are normal.  Neck: Normal range of motion.  Cardiovascular: Normal rate, regular rhythm and intact distal pulses.  Pulmonary/Chest: Effort normal and breath sounds normal.  Abdominal: Soft. There is no tenderness.  Musculoskeletal:  Healing lacerations to left thumb and long finger.  No significant erythema, discharge.  Flap is pink and viable.  Patient is neurovascularly intact with tenderness to light palpation diffusely to the affected digits.  Patient refuses to move the thumb however when the thumb is passively moved she keeps it in the last position.  Neurological: She is alert and oriented to person, place, and time.  Skin: She is not diaphoretic.  Psychiatric: She has a normal mood and affect.  Nursing note and vitals reviewed.            ED Treatments / Results  Labs (all labs ordered are listed, but only abnormal results are displayed) Labs Reviewed - No data to display  EKG None  Radiology No results found.  Procedures .Suture Removal Date/Time: 10/20/2017 2:01 PM Performed by: Monico Blitz, PA-C Authorized by: Monico Blitz, PA-C   Consent:    Consent obtained:  Verbal   Consent given by:  Patient   Risks discussed:  Wound separation, pain and bleeding   Alternatives discussed:  Delayed treatment, alternative treatment, observation, referral and no treatment Location:    Location:  Upper extremity   Upper extremity location:  Hand   Hand location:  L thumb Procedure details:    Wound appearance:  No signs of infection, good wound healing, clean and pink   Number of sutures removed:  13 Post-procedure details:    Post-removal:  Antibiotic ointment applied   Patient tolerance of procedure:  Tolerated with difficulty      .Suture Removal Date/Time: 10/20/2017 2:01 PM Performed by: Monico Blitz, PA-C Authorized by: Monico Blitz, PA-C   Consent:    Consent obtained:  Verbal   Consent given by:  Patient   Risks discussed:  Wound separation, pain and bleeding   Alternatives discussed:  Delayed treatment, alternative treatment, observation, referral and no treatment Location:    Location:  Upper extremity   Upper extremity location:  Hand   Hand location:  L long finger Procedure details:    Wound appearance:  No signs of infection, good wound healing, clean and pink   Number of sutures removed:  6 Post-procedure details:    Post-removal:  Antibiotic ointment applied   Patient tolerance of procedure:  Tolerated with difficulty  (including critical care time)  Medications Ordered in ED Medications  bacitracin ointment 1 application (has  no administration in time range)     Initial Impression / Assessment and Plan / ED Course  I have reviewed the triage vital signs and the nursing notes.  Pertinent labs & imaging results that were available during my care of the patient were reviewed by me and considered in my medical decision making (see chart for details).     Vitals:   10/20/17 0911 10/20/17 1349  BP: (!) 146/94 (!) 133/97  Pulse: 72 77  Resp: 16 18  Temp: 99 F (37.2 C)   TempSrc: Oral   SpO2: 100% 98%    Medications  bacitracin ointment 1 application (has no administration in time range)    Monique Duncan is 32 y.o. female presenting with reported anesthesia and decreased range of motion to left thumb.  She had a deep flap-like laceration near occurring 8 days ago.  On my exam patient with normal sensation, she is refusing to move the thumb however she keeps it in the last position it was passively moved to.  I doubt there is a significant tendon complication.  Sutures are removed at this patient's request with great discomfort on the patient's part.  I have advised her to  wash the area morning and night and apply bacitracin or Neosporin, hand surgery referral was given, she is invited to return to the ED for any concerns.  Evaluation does not show pathology that would require ongoing emergent intervention or inpatient treatment. Pt is hemodynamically stable and mentating appropriately. Discussed findings and plan with patient/guardian, who agrees with care plan. All questions answered. Return precautions discussed and outpatient follow up given.      Final Clinical Impressions(s) / ED Diagnoses   Final diagnoses:  Visit for wound check    ED Discharge Orders    None       Karen Kays Charna Elizabeth 10/20/17 Liborio Negron Torres, [place], DO 10/20/17 [phone]

## 2017-11-09 DIAGNOSIS — S61019A Laceration without foreign body of unspecified thumb without damage to nail, initial encounter: Secondary | ICD-10-CM | POA: Insufficient documentation

## 2018-02-12 IMAGING — CT CT ABD-PELV W/ CM
2 of 3 series · 10 of 46 positions shown, 11 images · IV contrast (Iodine)
Comparison: 06/01/2015, 07/17/2014

CLINICAL DATA: Right lower quadrant pain and fevers

EXAM:
CT ABDOMEN AND PELVIS WITH CONTRAST
TECHNIQUE: Multidetector CT imaging of the abdomen and pelvis was performed
using the standard protocol following bolus administration of
intravenous contrast.
CONTRAST:  100 mL [id number] IOPAMIDOL ([id number]) INJECTION 61%

[Series 201: routine, idose (2) · axial · 0.87mm/px · z∈[+168,+558]mm · 7 of 98 slices shown, 8 images]
[im 10/98  soft-tissue]
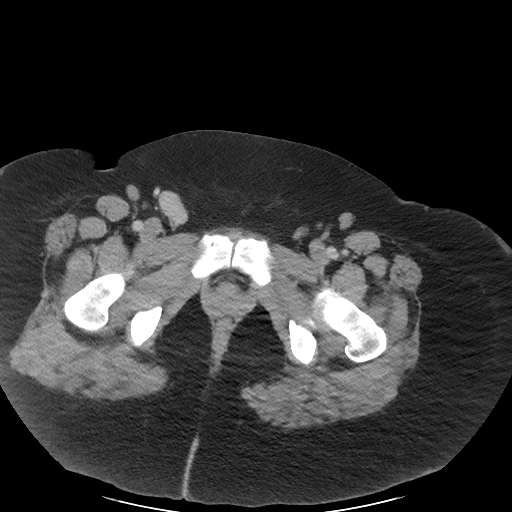
[im 10/98  bone]
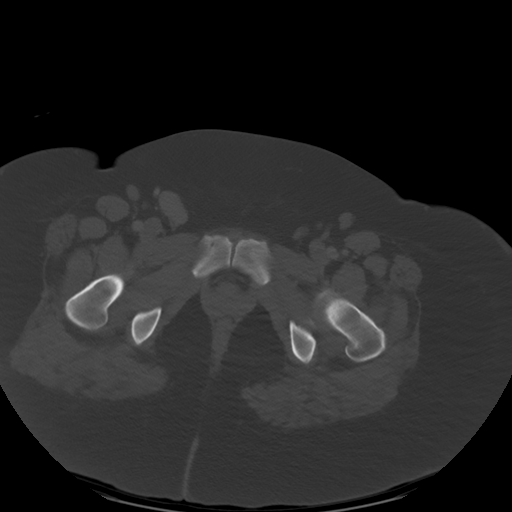
[im 22/98  soft-tissue]
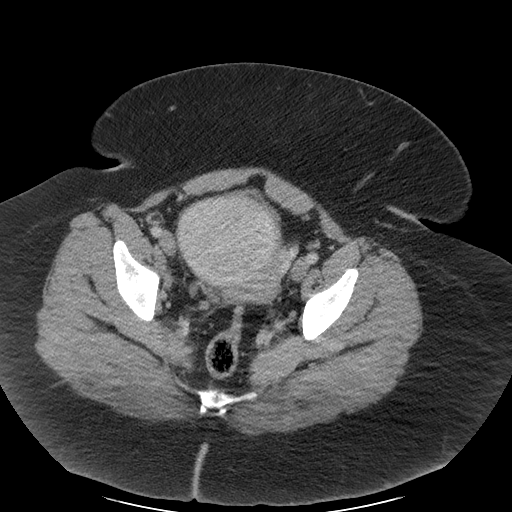
[im 35/98  soft-tissue]
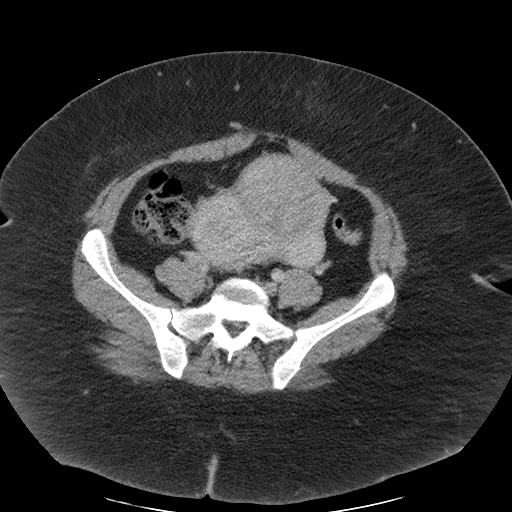
[im 51/98  soft-tissue]
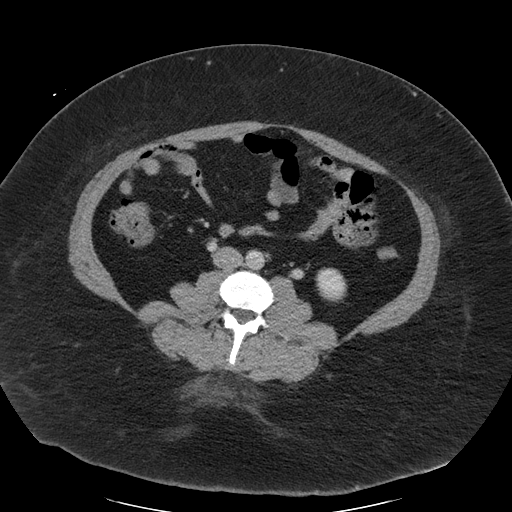
[im 63/98  soft-tissue]
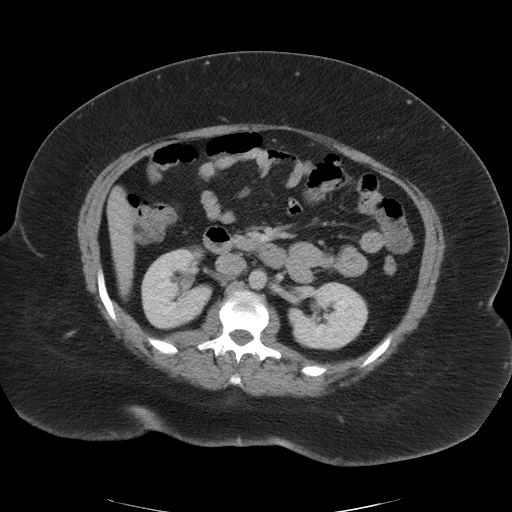
[im 76/98  soft-tissue]
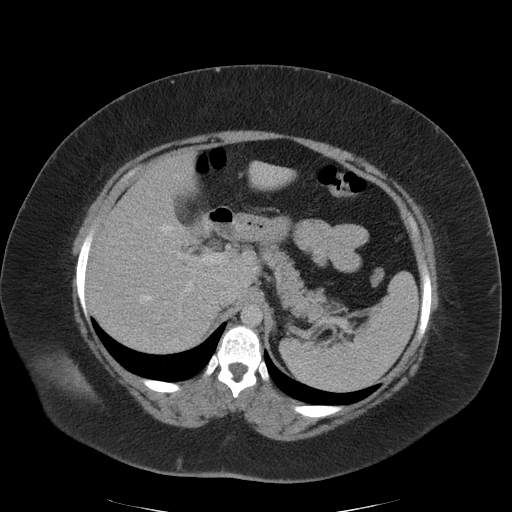
[im 88/98  soft-tissue]
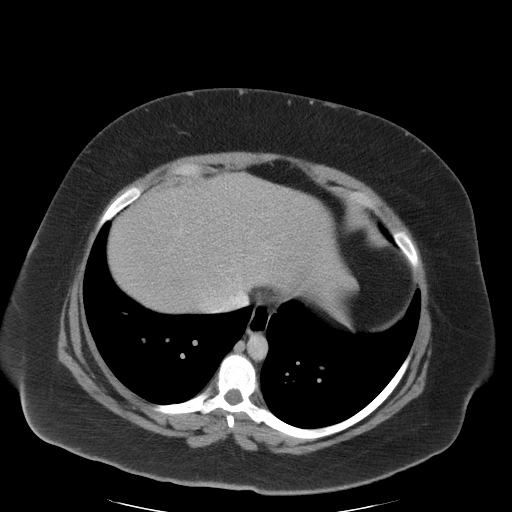

[Series 203: coronals, idose (2) · coronal · 0.45mm/px · 3 of 167 slices shown]
[im 56/167  soft-tissue]
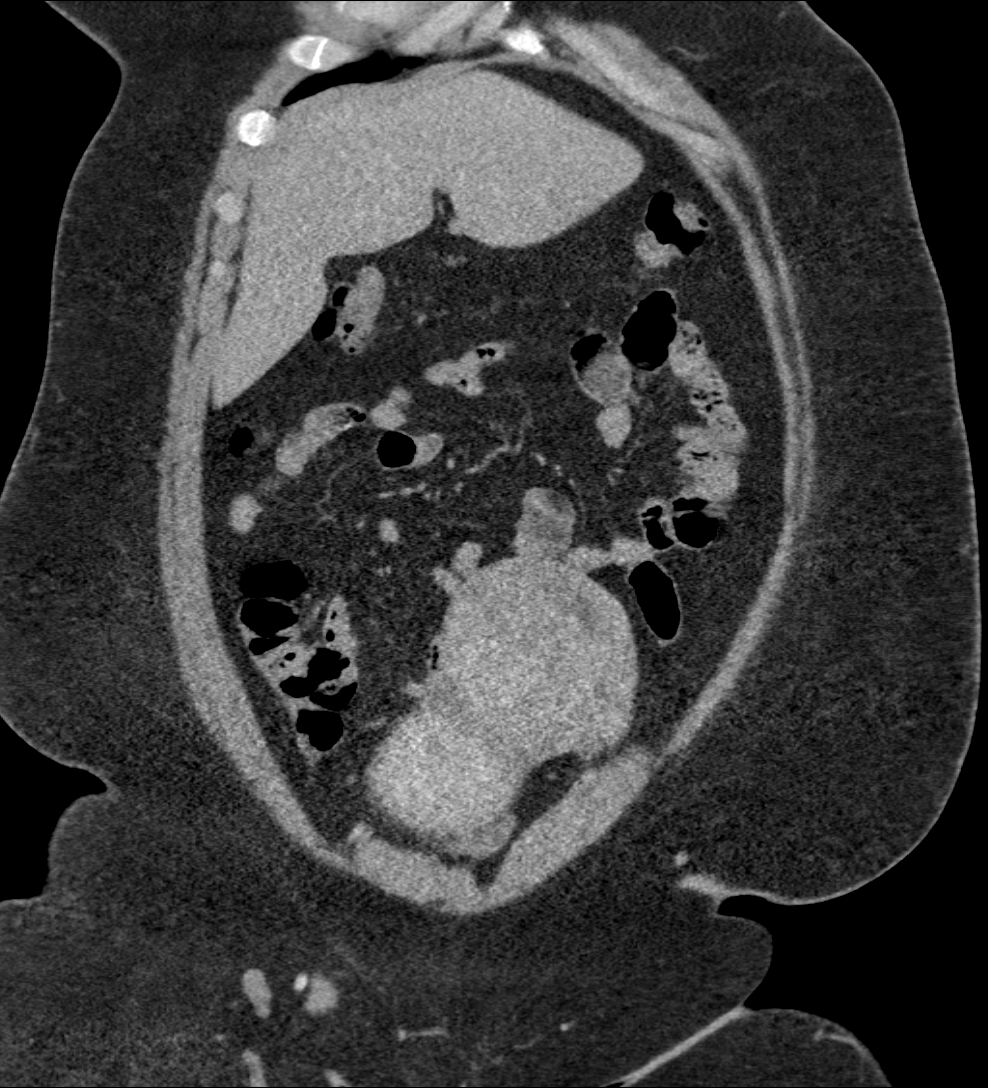
[im 74/167  soft-tissue]
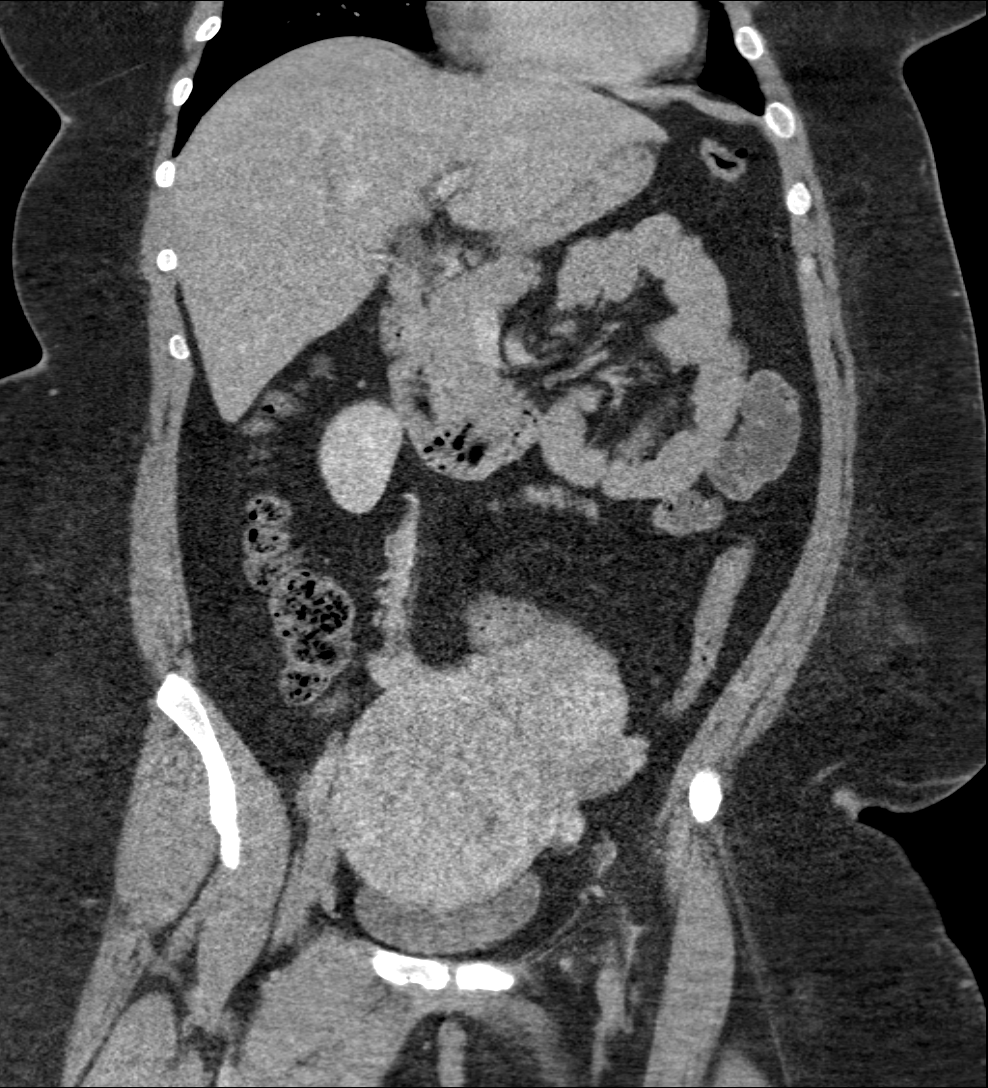
[im 93/167  soft-tissue]
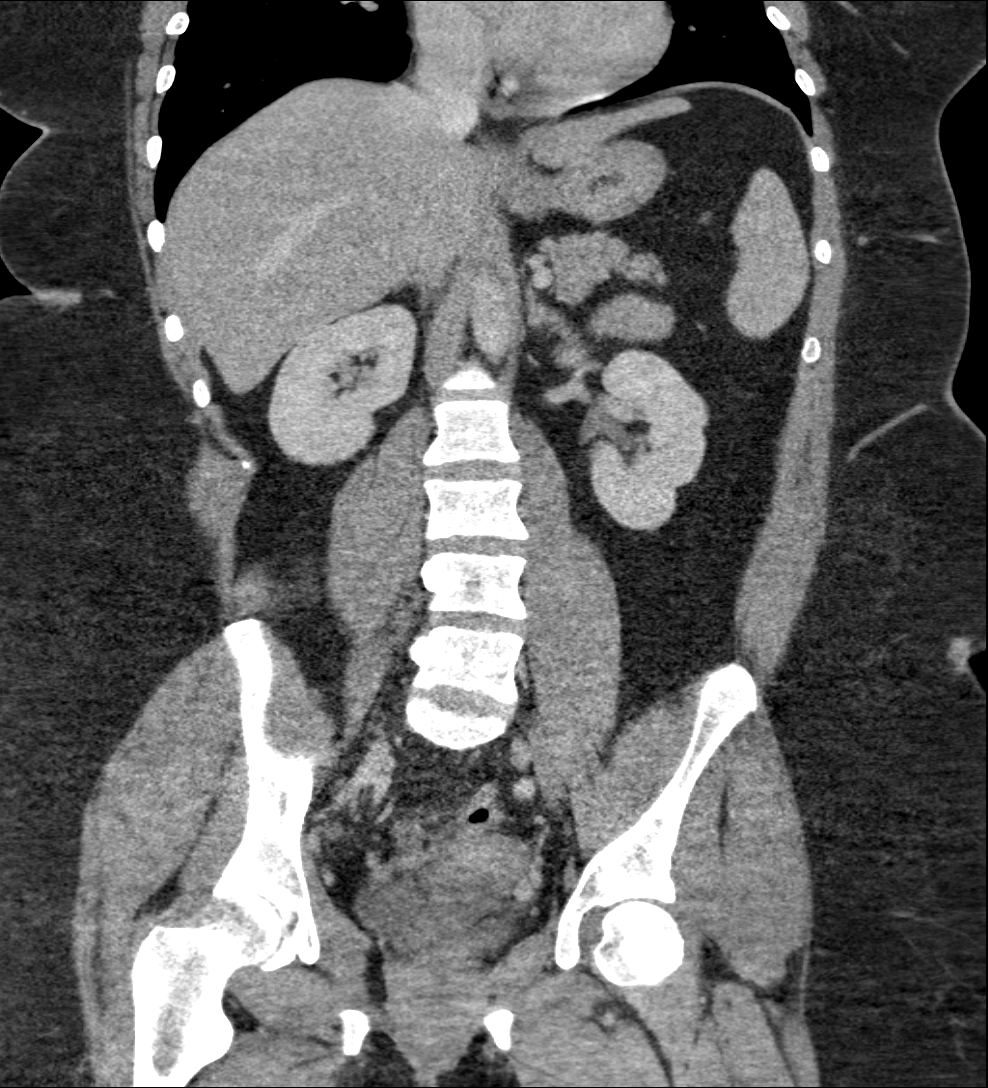

[10 of 46 positions shown; findings below may reference images not displayed]

FINDINGS: Lower chest: No focal infiltrate or sizable effusion is noted. A few
small subpleural nodules are seen in the left lower lobe measuring
approximately 3 mm in greatest dimension. These are stable from the
prior exam of 1021.

Hepatobiliary: Fatty infiltration of the liver is noted. The
gallbladder has been surgically removed.

Pancreas: No mass, inflammatory changes, or other significant
abnormality.

Spleen: Within normal limits in size and appearance.

Adrenals/Urinary Tract: No masses identified. No evidence of
hydronephrosis.

Stomach/Bowel: No evidence of obstruction, inflammatory process, or
abnormal fluid collections. The appendix is within normal limits.

Vascular/Lymphatic: No pathologically enlarged lymph nodes. No
evidence of abdominal aortic aneurysm.

Reproductive: Uterus again demonstrates fibroid change with a large
relatively pedunculated fibroid. These changes are stable from the
prior exam.

Other: No free fluid is seen.

Musculoskeletal:  No suspicious bone lesions identified.
IMPRESSION: Fibroid uterus.

Stable subpleural nodules on the left.

No acute abnormality noted.

## 2018-11-07 IMAGING — CT CT ABD-PELV W/ CM
2 of 4 series · 16 of 46 positions shown, 18 images · IV contrast (APPLIED)
Comparison: CT 03/15/2016

CLINICAL DATA: Left lower quadrant pain for 4 days.

EXAM:
CT ABDOMEN AND PELVIS WITH CONTRAST
TECHNIQUE: Multidetector CT imaging of the abdomen and pelvis was performed
using the standard protocol following bolus administration of
intravenous contrast.
CONTRAST:  100mL [id number] IOPAMIDOL ([id number]-WKK) INJECTION 61%

[Series 3: abd/ pelvis 5.0 i30f 2 · axial · 0.91mm/px · z∈[+814,+1254]mm · 13 of 98 slices shown, 15 images]
[im 5/98  soft-tissue]
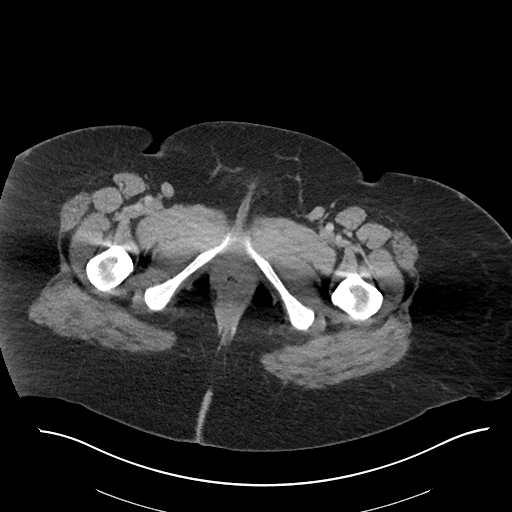
[im 5/98  bone]
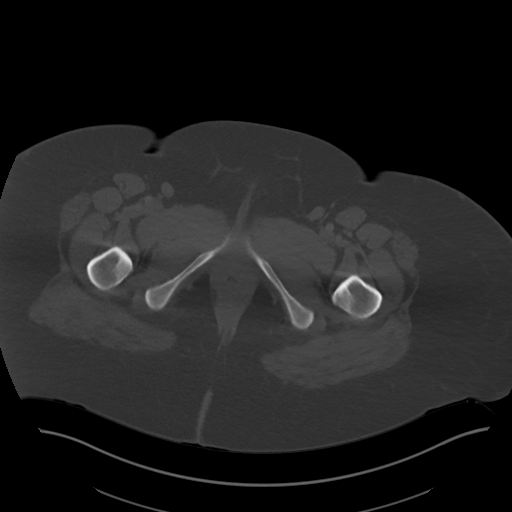
[im 13/98  soft-tissue]
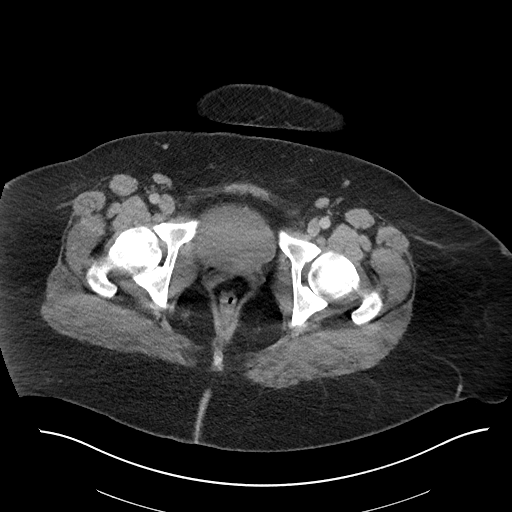
[im 21/98  soft-tissue]
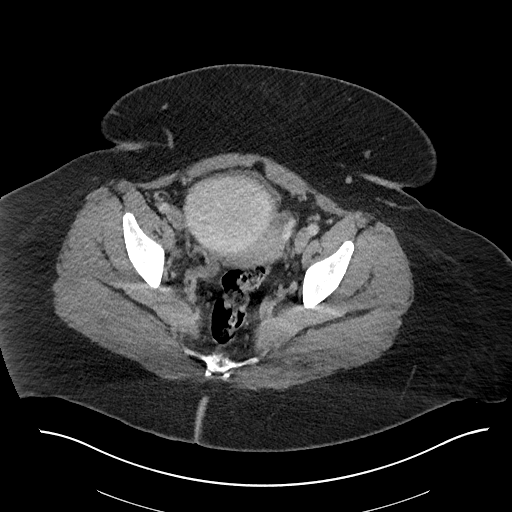
[im 29/98  soft-tissue]
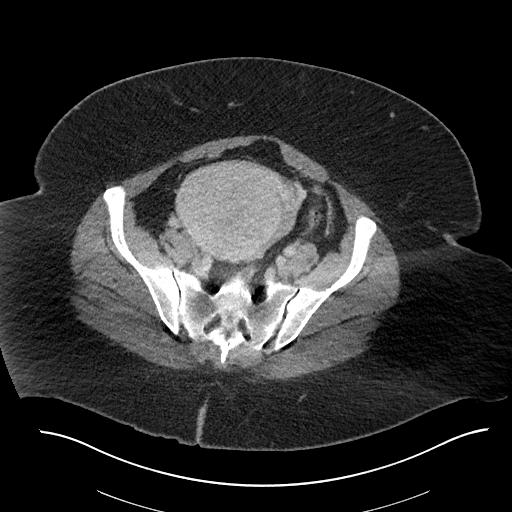
[im 33/98  soft-tissue]
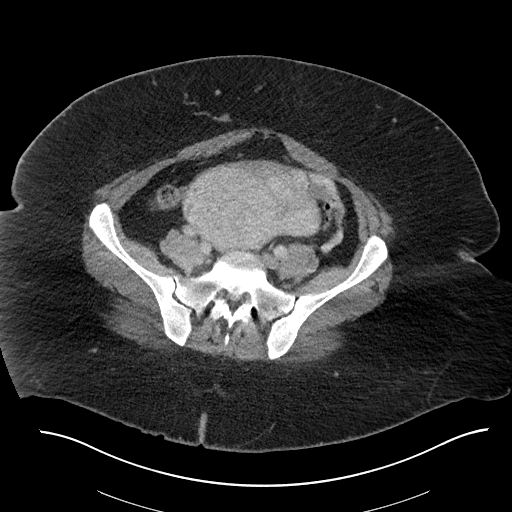
[im 41/98  soft-tissue]
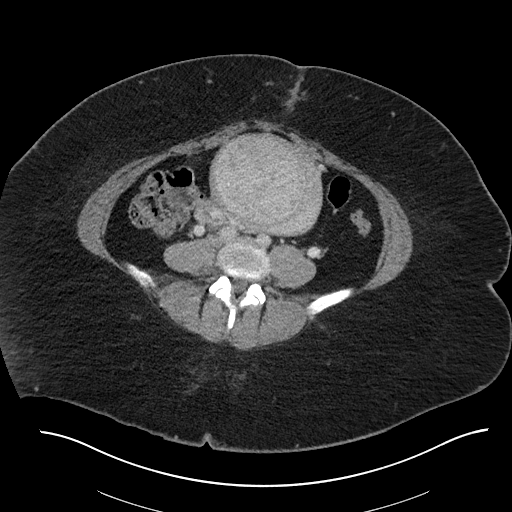
[im 49/98  soft-tissue]
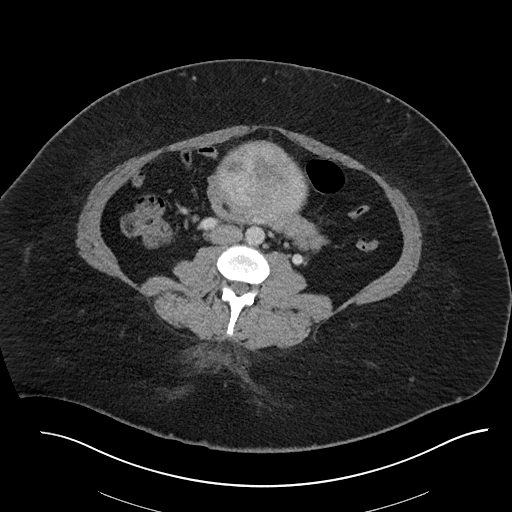
[im 57/98  soft-tissue]
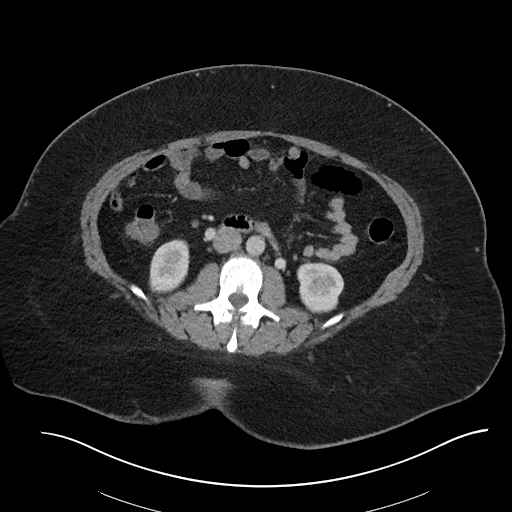
[im 65/98  soft-tissue]
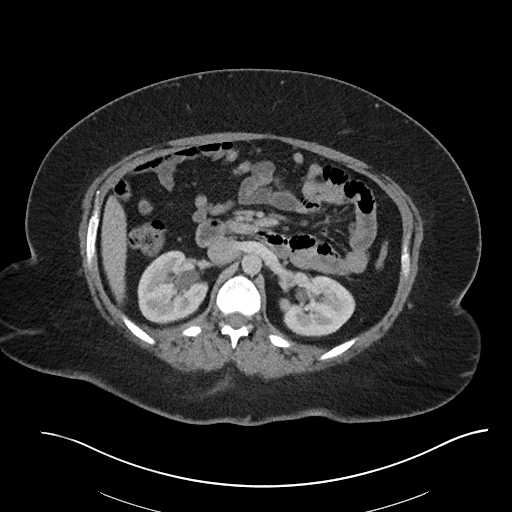
[im 65/98  bone]
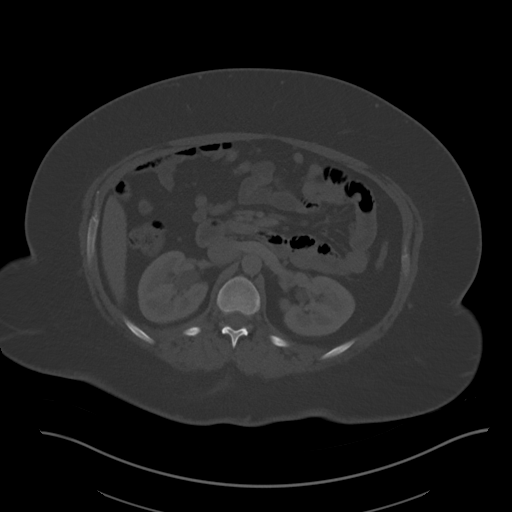
[im 69/98  soft-tissue]
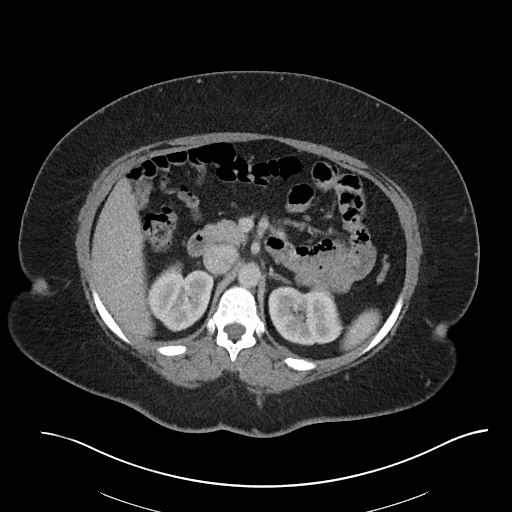
[im 77/98  soft-tissue]
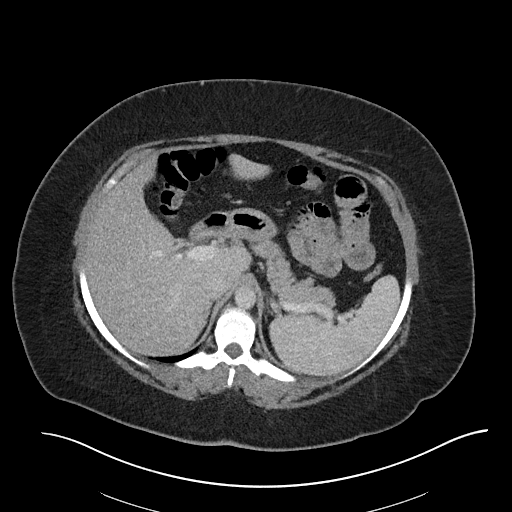
[im 85/98  soft-tissue]
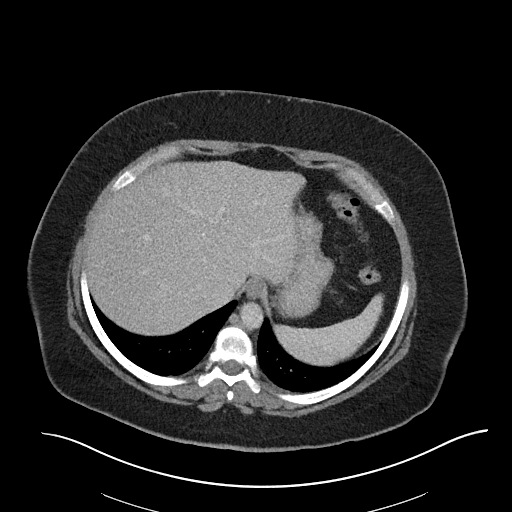
[im 93/98  soft-tissue]
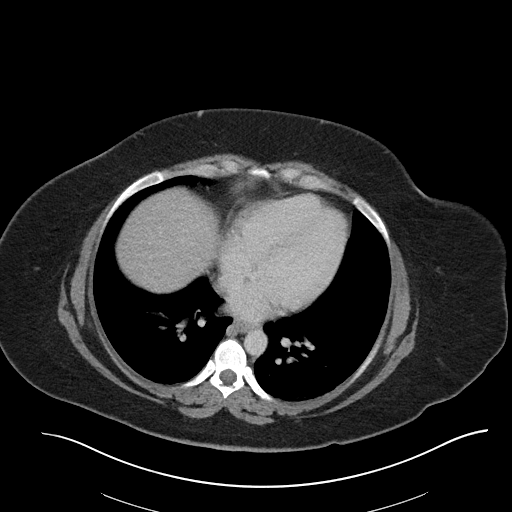

[Series 6: coronal soft tissue · coronal · 0.99mm/px · 3 of 97 slices shown]
[im 33/97  soft-tissue]
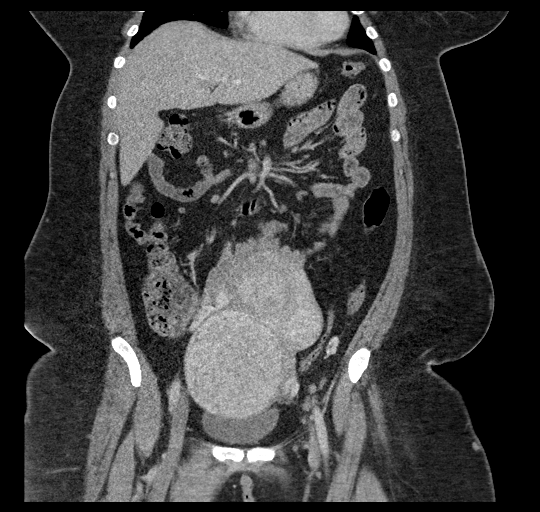
[im 43/97  soft-tissue]
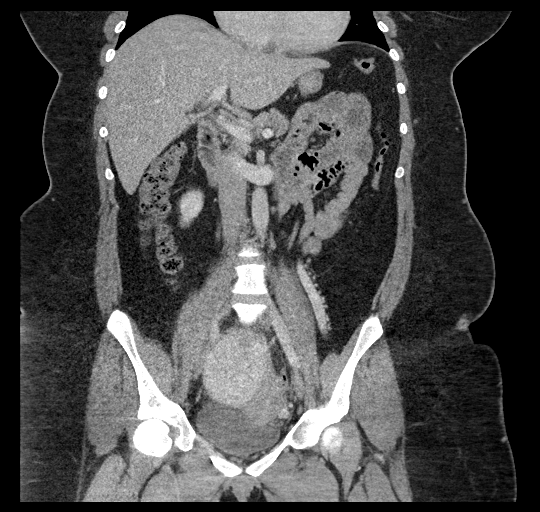
[im 54/97  soft-tissue]
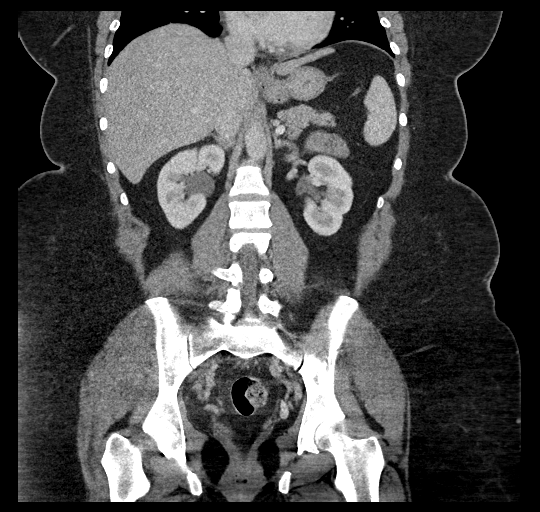

[16 of 46 positions shown; findings below may reference images not displayed]

FINDINGS: Lower chest: Dependent atelectasis in the lung bases, partially
obscuring the previous tiny a subpleural left lower lobe nodules.

Hepatobiliary: Decreased hepatic density consistent with steatosis.
No focal lesion. Postcholecystectomy. No biliary dilatation.

Pancreas: No ductal dilatation or inflammation.

Spleen: Normal in size without focal abnormality.

Adrenals/Urinary Tract: Normal adrenal glands. Prominence of the
bilateral renal pelvises without frank hydronephrosis. No
perinephric edema. Urinary bladder is decompressed.

Stomach/Bowel: Stomach is within normal limits. Appendix appears
normal. No evidence of bowel wall thickening, distention, or
inflammatory changes.

Vascular/Lymphatic: Distended ovarian veins, 9 mm on the left and 8
mm on the right. Prominent adnexal vascularity. No evidence of
adenopathy. Prominent bilateral inguinal nodes are all subcentimeter
short axis and unchanged.

Reproductive: Enlarged fibroid uterus measuring at least 18 8 cm in
reaching above the umbilicus. Dominant fundal fibroid measures 9 cm,
unchanged. A dominant lower fibroid measures 9.2 cm is, also
unchanged. Ovaries are not discretely visualized. There are dilated
bilateral gonadal veins.

Other: Minimal free fluid in the pelvis. No upper abdominal ascites.
No free air. Postsurgical change in the anterior abdominal wall.

Musculoskeletal: There are no acute or suspicious osseous
abnormalities.
IMPRESSION: 1. Enlarged fibroid uterus, similar in appearance to prior exam.
There are dilated bilateral gonadal veins, left greater than right,
suggesting pelvic congestion syndrome.
2. Mild bilateral pelvicaliectasis, new from prior. This may be
secondary to mass-effect on the distal ureters from fibroid uterus.
No urolithiasis.

## 2019-06-30 DIAGNOSIS — M25562 Pain in left knee: Secondary | ICD-10-CM | POA: Insufficient documentation

## 2019-10-01 DIAGNOSIS — F331 Major depressive disorder, recurrent, moderate: Secondary | ICD-10-CM | POA: Insufficient documentation

## 2019-10-01 DIAGNOSIS — M792 Neuralgia and neuritis, unspecified: Secondary | ICD-10-CM | POA: Insufficient documentation

## 2019-10-01 DIAGNOSIS — R519 Headache, unspecified: Secondary | ICD-10-CM | POA: Insufficient documentation

## 2019-10-01 DIAGNOSIS — E039 Hypothyroidism, unspecified: Secondary | ICD-10-CM | POA: Insufficient documentation

## 2019-12-29 ENCOUNTER — Telehealth (INDEPENDENT_AMBULATORY_CARE_PROVIDER_SITE_OTHER): Payer: 59 | Admitting: Psychiatry

## 2019-12-29 ENCOUNTER — Encounter (HOSPITAL_COMMUNITY): Payer: Self-pay | Admitting: Psychiatry

## 2019-12-29 DIAGNOSIS — F331 Major depressive disorder, recurrent, moderate: Secondary | ICD-10-CM

## 2019-12-29 DIAGNOSIS — F4310 Post-traumatic stress disorder, unspecified: Secondary | ICD-10-CM | POA: Diagnosis not present

## 2019-12-29 DIAGNOSIS — F411 Generalized anxiety disorder: Secondary | ICD-10-CM

## 2019-12-29 MED ORDER — ESCITALOPRAM OXALATE 10 MG PO TABS: 10.0000 mg | ORAL_TABLET | Freq: Every day | ORAL | 0 refills | Status: DC
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
## 2020-01-18 ENCOUNTER — Ambulatory Visit (INDEPENDENT_AMBULATORY_CARE_PROVIDER_SITE_OTHER): Payer: 59 | Admitting: Licensed Clinical Social Worker

## 2020-01-18 ENCOUNTER — Other Ambulatory Visit: Payer: Self-pay

## 2020-01-18 DIAGNOSIS — F411 Generalized anxiety disorder: Secondary | ICD-10-CM

## 2020-01-18 DIAGNOSIS — F331 Major depressive disorder, recurrent, moderate: Secondary | ICD-10-CM | POA: Diagnosis not present
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
## 2020-01-23 ENCOUNTER — Encounter (HOSPITAL_COMMUNITY): Payer: Self-pay | Admitting: Psychiatry

## 2020-01-23 ENCOUNTER — Telehealth (INDEPENDENT_AMBULATORY_CARE_PROVIDER_SITE_OTHER): Payer: 59 | Admitting: Psychiatry

## 2020-01-23 DIAGNOSIS — F4310 Post-traumatic stress disorder, unspecified: Secondary | ICD-10-CM

## 2020-01-23 DIAGNOSIS — F331 Major depressive disorder, recurrent, moderate: Secondary | ICD-10-CM | POA: Diagnosis not present

## 2020-01-23 MED ORDER — ESCITALOPRAM OXALATE 10 MG PO TABS: 15.0000 mg | ORAL_TABLET | Freq: Every day | ORAL | 0 refills | Status: DC
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
## 2020-01-24 ENCOUNTER — Other Ambulatory Visit (HOSPITAL_COMMUNITY): Payer: Self-pay | Admitting: Psychiatry

## 2020-02-09 ENCOUNTER — Ambulatory Visit (INDEPENDENT_AMBULATORY_CARE_PROVIDER_SITE_OTHER): Payer: 59 | Admitting: Licensed Clinical Social Worker

## 2020-02-09 ENCOUNTER — Other Ambulatory Visit: Payer: Self-pay

## 2020-02-09 DIAGNOSIS — F411 Generalized anxiety disorder: Secondary | ICD-10-CM

## 2020-02-09 DIAGNOSIS — F331 Major depressive disorder, recurrent, moderate: Secondary | ICD-10-CM | POA: Diagnosis not present
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
## 2020-02-21 ENCOUNTER — Other Ambulatory Visit (HOSPITAL_COMMUNITY): Payer: Self-pay | Admitting: Psychiatry

## 2020-02-27 ENCOUNTER — Telehealth (INDEPENDENT_AMBULATORY_CARE_PROVIDER_SITE_OTHER): Payer: 59 | Admitting: Psychiatry

## 2020-02-27 ENCOUNTER — Encounter (HOSPITAL_COMMUNITY): Payer: Self-pay | Admitting: Psychiatry

## 2020-02-27 DIAGNOSIS — F411 Generalized anxiety disorder: Secondary | ICD-10-CM

## 2020-02-27 DIAGNOSIS — F331 Major depressive disorder, recurrent, moderate: Secondary | ICD-10-CM

## 2020-02-27 DIAGNOSIS — F4310 Post-traumatic stress disorder, unspecified: Secondary | ICD-10-CM
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
## 2020-03-01 ENCOUNTER — Other Ambulatory Visit: Payer: Self-pay

## 2020-03-01 ENCOUNTER — Ambulatory Visit (INDEPENDENT_AMBULATORY_CARE_PROVIDER_SITE_OTHER): Payer: 59 | Admitting: Licensed Clinical Social Worker

## 2020-03-01 DIAGNOSIS — F411 Generalized anxiety disorder: Secondary | ICD-10-CM
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
## 2020-03-08 ENCOUNTER — Ambulatory Visit (INDEPENDENT_AMBULATORY_CARE_PROVIDER_SITE_OTHER): Payer: 59 | Admitting: Licensed Clinical Social Worker

## 2020-03-08 ENCOUNTER — Other Ambulatory Visit: Payer: Self-pay

## 2020-03-08 DIAGNOSIS — F411 Generalized anxiety disorder: Secondary | ICD-10-CM
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
## 2020-03-12 ENCOUNTER — Encounter (HOSPITAL_COMMUNITY): Payer: Self-pay | Admitting: Psychiatry

## 2020-03-15 ENCOUNTER — Ambulatory Visit (INDEPENDENT_AMBULATORY_CARE_PROVIDER_SITE_OTHER): Payer: 59 | Admitting: Licensed Clinical Social Worker

## 2020-03-15 ENCOUNTER — Other Ambulatory Visit: Payer: Self-pay

## 2020-03-15 DIAGNOSIS — F411 Generalized anxiety disorder: Secondary | ICD-10-CM
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
## 2020-03-31 ENCOUNTER — Other Ambulatory Visit (HOSPITAL_COMMUNITY): Payer: Self-pay | Admitting: Psychiatry

## 2020-04-02 ENCOUNTER — Ambulatory Visit (INDEPENDENT_AMBULATORY_CARE_PROVIDER_SITE_OTHER): Payer: 59 | Admitting: Licensed Clinical Social Worker

## 2020-04-02 DIAGNOSIS — F411 Generalized anxiety disorder: Secondary | ICD-10-CM
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
## 2020-04-16 ENCOUNTER — Ambulatory Visit (HOSPITAL_COMMUNITY): Payer: 59 | Admitting: Licensed Clinical Social Worker

## 2020-04-30 ENCOUNTER — Ambulatory Visit (INDEPENDENT_AMBULATORY_CARE_PROVIDER_SITE_OTHER): Payer: 59 | Admitting: Licensed Clinical Social Worker

## 2020-04-30 DIAGNOSIS — F411 Generalized anxiety disorder: Secondary | ICD-10-CM
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
## 2020-05-05 ENCOUNTER — Other Ambulatory Visit (HOSPITAL_COMMUNITY): Payer: Self-pay | Admitting: Psychiatry

## 2020-05-23 ENCOUNTER — Other Ambulatory Visit: Payer: Self-pay

## 2020-05-23 ENCOUNTER — Ambulatory Visit (INDEPENDENT_AMBULATORY_CARE_PROVIDER_SITE_OTHER): Payer: 59 | Admitting: Licensed Clinical Social Worker

## 2020-05-23 DIAGNOSIS — F411 Generalized anxiety disorder: Secondary | ICD-10-CM
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
## 2020-05-27 ENCOUNTER — Ambulatory Visit: Payer: 59 | Admitting: Rehabilitative and Restorative Service Providers"

## 2020-05-30 ENCOUNTER — Telehealth (HOSPITAL_COMMUNITY): Payer: 59 | Admitting: Psychiatry

## 2020-05-31 ENCOUNTER — Telehealth (INDEPENDENT_AMBULATORY_CARE_PROVIDER_SITE_OTHER): Payer: 59 | Admitting: Psychiatry

## 2020-05-31 ENCOUNTER — Encounter (HOSPITAL_COMMUNITY): Payer: Self-pay | Admitting: Psychiatry

## 2020-05-31 DIAGNOSIS — F331 Major depressive disorder, recurrent, moderate: Secondary | ICD-10-CM | POA: Diagnosis not present

## 2020-05-31 DIAGNOSIS — F411 Generalized anxiety disorder: Secondary | ICD-10-CM | POA: Diagnosis not present

## 2020-05-31 DIAGNOSIS — F4310 Post-traumatic stress disorder, unspecified: Secondary | ICD-10-CM

## 2020-05-31 MED ORDER — ESCITALOPRAM OXALATE 10 MG PO TABS: 15.0000 mg | ORAL_TABLET | Freq: Every day | ORAL | 1 refills | Status: DC
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
## 2020-06-03 ENCOUNTER — Other Ambulatory Visit (HOSPITAL_COMMUNITY): Payer: Self-pay | Admitting: Psychiatry

## 2020-06-06 ENCOUNTER — Ambulatory Visit (INDEPENDENT_AMBULATORY_CARE_PROVIDER_SITE_OTHER): Payer: 59 | Admitting: Licensed Clinical Social Worker

## 2020-06-06 DIAGNOSIS — F411 Generalized anxiety disorder: Secondary | ICD-10-CM
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
## 2020-06-19 ENCOUNTER — Other Ambulatory Visit: Payer: Self-pay

## 2020-06-19 ENCOUNTER — Ambulatory Visit (INDEPENDENT_AMBULATORY_CARE_PROVIDER_SITE_OTHER): Payer: 59 | Admitting: Licensed Clinical Social Worker

## 2020-06-19 ENCOUNTER — Ambulatory Visit (HOSPITAL_COMMUNITY): Payer: 59 | Admitting: Licensed Clinical Social Worker

## 2020-06-19 DIAGNOSIS — F411 Generalized anxiety disorder: Secondary | ICD-10-CM
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
## 2020-07-04 ENCOUNTER — Ambulatory Visit (INDEPENDENT_AMBULATORY_CARE_PROVIDER_SITE_OTHER): Payer: 59 | Admitting: Licensed Clinical Social Worker

## 2020-07-04 ENCOUNTER — Other Ambulatory Visit: Payer: Self-pay

## 2020-07-04 DIAGNOSIS — F411 Generalized anxiety disorder: Secondary | ICD-10-CM | POA: Diagnosis not present
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
## 2020-08-06 ENCOUNTER — Ambulatory Visit (INDEPENDENT_AMBULATORY_CARE_PROVIDER_SITE_OTHER): Payer: 59 | Admitting: Licensed Clinical Social Worker

## 2020-08-06 DIAGNOSIS — F411 Generalized anxiety disorder: Secondary | ICD-10-CM | POA: Diagnosis not present

## 2020-08-06 NOTE — Progress Notes (Signed)
Virtual Visit via Video Note  I connected with Monique Duncan on 08/07/20 at  2:00 PM EST by a video enabled telemedicine application and verified that I am speaking with the correct person using two identifiers.  Location: Patient: Home Provider: Office   I discussed the limitations of evaluation and management by telemedicine and the availability of in person appointments. The patient expressed understanding and agreed to proceed.  THERAPIST PROGRESS NOTE  Session Time: 3:00 pm-3:40 pm  Type of Therapy: Individual Therapy  Session #11  Purpose of Session/Treatment Goals addressed: "Beyla will manage mood and anxiety as evidenced by reducing irritability, coping with stressors, manage anxious thoughts and physical aspects of anxiety, and express emotions appropriately for 5 out of 7 days for 60 days."  Interventions:  Therapist utilized CBT and Solution Focused brief therapy to address mood and anxiety. Therapist provided support and empathy to patient as they shared their concerns, thoughts, and feelings related to being overwhelmed in life. Therapist discussed with patient the role of distorted thinking in precipitating emotional responses including the feeling that everything depends on her or she has to get everything accomplished. Therapist assisted patient in identifying what had to be accomplished vs what could wait as well as having reasonable expectations for herself.  Effectiveness: Patient presented oriented x5 (person, place, situation, time, and object). Patient was causally dressed and appropriately groomed. Patient maintained eye contact and communicated throughout the session. Patient was pleasant but sounded as well as expressed stress. Patient feels overwhelmed. She is caring for her niece while her sister works, taking her nephew to school, picking up her teenage niece and nephew for school or other activities, caring for her mother who had a procedure, and trying to deal  with her court dates. Patient feels like she gets no help from her teen niece and nephew and is getting pressure from her mother to maintain the home. Patient understood that feeling like she has to be two places at once and do everything is unrealistic. Patient admitted that nothing would happen if she didn't clean the house while attending to everything else. She recognized some things she could "let go of." Patient needs time to herself and must realize she can't do everything "perfectly" or "be everything to everyone" or she will burn out. Patient gets stressed with her court dates and has a court date this week which explains the increase in stress/anxiety. Patient did note her lawyer asked that the case be continued for several months so she would not have to take frequent trips across the state just to be continued for another month. She noted some relief from this.   Patient engaged in session. She responded well to interventions. Patient continues to meet criteria for Major Depressive disorder, recurrent episode, moderate and Generalized Anxiety Disorder. Patient made moderate progress on her goals at this time.   Suicidal/Homicidal: Negativewithout intent/plan  Plan: Return again in 2-4 weeks. Patient will take things one step at a time and control what she can control. She will focus on self care and letting some things go or wait until later while attending to the important items she must accomplish vs what she would like to accomplish.  Diagnosis: Axis I: Generalized Anxiety Disorder and MDD (major depressive disorder), recurrent episode, moderate    Axis II: No diagnosis I discussed the assessment and treatment plan with the patient. The patient was provided an opportunity to ask questions and all were answered. The patient agreed with the plan and demonstrated  an understanding of the instructions.   The patient was advised to call back or seek an in-person evaluation if the symptoms  worsen or if the condition fails to improve as anticipated.  I provided 40 minutes of non-face-to-face time during this encounter.   Glori Bickers, LCSW 08/06/2020

## 2020-08-20 ENCOUNTER — Ambulatory Visit (INDEPENDENT_AMBULATORY_CARE_PROVIDER_SITE_OTHER): Payer: 59 | Admitting: Licensed Clinical Social Worker

## 2020-08-20 DIAGNOSIS — F411 Generalized anxiety disorder: Secondary | ICD-10-CM

## 2020-08-21 NOTE — Progress Notes (Signed)
Virtual Visit via Video Note  I connected with Monique Duncan on 08/21/20 at  1:00 PM EST by a video enabled telemedicine application and verified that I am speaking with the correct person using two identifiers.  Location: Patient: Home Provider: Office   I discussed the limitations of evaluation and management by telemedicine and the availability of in person appointments. The patient expressed understanding and agreed to proceed.  THERAPIST PROGRESS NOTE  Session Time: 1:00 pm-1:40 pm  Type of Therapy: Individual Therapy  Session #12  Purpose of Session/Treatment Goals addressed: "Monique Duncan will manage mood and anxiety as evidenced by reducing irritability, coping with stressors, manage anxious thoughts and physical aspects of anxiety, and express emotions appropriately for 5 out of 7 days for 60 days."  Interventions:  Therapist utilized CBT and Solution Focused brief therapy to address mood and anxiety. Therapist provided support and empathy to patient while she shared her thoughts and feelings during session. Therapist had patient verbalize her triggers for anxiety. Therapist had patient verbalize her negative interactions with her family member. Therapist assisted patient in developing a plan to speak more "positively" toward her nephew on a daily basis.   Effectiveness: Patient presented oriented x5 (person, place, situation, time, and object). Patient was dressed casually and groomed appropriately. She was alert and pleasant during session. Patient continues to feel overwhelmed. She feels like attending to the needs of her nieces, nephew, and mother are overwhelming her. She feels on edge/irritable at times. Patient feels like her major concern right now is her relationship with her nephew. She has had a good relationship with him in the past and feels like things are strained. Patient said that her nephew will come in from school and rile up her niece/his sister after she has been  quietly playing all day. This adds stress to her. Patient feels like she is always on her nephew. Patient wants to work on the relationship. After discussion, patient agreed to work on saying more positive things to her nephew on a daily basis. Patient has worked on having having reasonable expectations for herself daily between session. She feels like she was able to do that 4 out of 7 days.   Patient engaged  in session. She responded well to interventions. Patient continues to meet criteria for Major Depressive disorder, recurrent episode, moderate and Generalized Anxiety Disorder. Patient made moderate progress on her goals at this time.   Suicidal/Homicidal: Negativewithout intent/plan  Plan: Return again in 2-4 weeks. Patient will focus on reframing interactions with her nephew and speak more "positives" toward him rather than feeling like she always is upset with him.    Diagnosis: Axis I: Generalized Anxiety Disorder and MDD (major depressive disorder), recurrent episode, moderate    Axis II: No diagnosis I discussed the assessment and treatment plan with the patient. The patient was provided an opportunity to ask questions and all were answered. The patient agreed with the plan and demonstrated an understanding of the instructions.   The patient was advised to call back or seek an in-person evaluation if the symptoms worsen or if the condition fails to improve as anticipated.  I provided 40 minutes of non-face-to-face time during this encounter.   Glori Bickers, LCSW 08/21/2020

## 2020-08-26 ENCOUNTER — Encounter (HOSPITAL_COMMUNITY): Payer: Self-pay | Admitting: Psychiatry

## 2020-08-26 ENCOUNTER — Telehealth (INDEPENDENT_AMBULATORY_CARE_PROVIDER_SITE_OTHER): Payer: 59 | Admitting: Psychiatry

## 2020-08-26 DIAGNOSIS — F4310 Post-traumatic stress disorder, unspecified: Secondary | ICD-10-CM | POA: Diagnosis not present

## 2020-08-26 DIAGNOSIS — F331 Major depressive disorder, recurrent, moderate: Secondary | ICD-10-CM | POA: Diagnosis not present

## 2020-08-26 DIAGNOSIS — F411 Generalized anxiety disorder: Secondary | ICD-10-CM | POA: Diagnosis not present

## 2020-08-26 MED ORDER — ESCITALOPRAM OXALATE 20 MG PO TABS: 20.0000 mg | ORAL_TABLET | Freq: Every day | ORAL | 1 refills | Status: DC
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
## 2020-08-29 DIAGNOSIS — S83249A Other tear of medial meniscus, current injury, unspecified knee, initial encounter: Secondary | ICD-10-CM | POA: Insufficient documentation

## 2020-09-03 ENCOUNTER — Telehealth (HOSPITAL_COMMUNITY): Payer: 59 | Admitting: Psychiatry

## 2020-09-05 ENCOUNTER — Ambulatory Visit (INDEPENDENT_AMBULATORY_CARE_PROVIDER_SITE_OTHER): Payer: 59 | Admitting: Licensed Clinical Social Worker

## 2020-09-05 DIAGNOSIS — F411 Generalized anxiety disorder: Secondary | ICD-10-CM
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
## 2020-09-17 ENCOUNTER — Ambulatory Visit (INDEPENDENT_AMBULATORY_CARE_PROVIDER_SITE_OTHER): Payer: 59 | Admitting: Licensed Clinical Social Worker

## 2020-09-17 DIAGNOSIS — F411 Generalized anxiety disorder: Secondary | ICD-10-CM | POA: Diagnosis not present

## 2020-09-18 NOTE — Progress Notes (Signed)
Virtual Visit via Video Note  I connected with Monique Duncan on 09/18/20 at  1:00 PM EST by a video enabled telemedicine application and verified that I am speaking with the correct person using two identifiers.  Location: Patient: Home Provider: Office   I discussed the limitations of evaluation and management by telemedicine and the availability of in person appointments. The patient expressed understanding and agreed to proceed.  THERAPIST PROGRESS NOTE  Session Time: 1:00 pm-1:45 pm  Type of Therapy: Individual Therapy  Session #13  Purpose of Session/Treatment Goals addressed: "Monique Duncan will manage mood and anxiety as evidenced by reducing irritability, coping with stressors, manage anxious thoughts and physical aspects of anxiety, and express emotions appropriately for 5 out of 7 days for 60 days."  Interventions:  Therapist utilized CBT and Solution Focused brief therapy to address anxiety and mood. Therapist provided space for patient to share her feelings and thoughts without judgement and with support in session. Therapist explored patient's physical pain and how it impacts her mood. Therapist worked with patient to identify steps to take to shift the dynamics in her relationship with her father and nephew.     Effectiveness: Patient was oriented x5 (person, place, situation, time, and object). Patient was dressed casually, and appropriately groomed. Patient was alert, engaged, and pleasant during session. Patient has a lot of knee pain. It is impacted her functioning. She has difficulty sleeping as well. She is scheduled to get surgery. Patient is working on her relationship with her nephew and father. She tried to talk to her father and he shut down. Patient is trying to be more positive with him and spend time with him.     Patient engaged in session. She responded well to interventions. Patient continues to meet criteria for Major Depressive disorder, recurrent episode, moderate  and Generalized Anxiety Disorder. Patient will continue in outpatient therapy due to being the least restrictive service to meet her needs. Patient made moderate progress on her goals at this time.   Suicidal/Homicidal: Negativewithout intent/plan  Plan: Return again in 2-4 weeks.   Diagnosis: Axis I: Generalized Anxiety Disorder and MDD (major depressive disorder), recurrent episode, moderate    Axis II: No diagnosis I discussed the assessment and treatment plan with the patient. The patient was provided an opportunity to ask questions and all were answered. The patient agreed with the plan and demonstrated an understanding of the instructions.   The patient was advised to call back or seek an in-person evaluation if the symptoms worsen or if the condition fails to improve as anticipated.  I provided 45 minutes of non-face-to-face time during this encounter.   Glori Bickers, LCSW 09/18/2020

## 2020-09-24 ENCOUNTER — Telehealth (INDEPENDENT_AMBULATORY_CARE_PROVIDER_SITE_OTHER): Payer: 59 | Admitting: Psychiatry

## 2020-09-24 ENCOUNTER — Encounter (HOSPITAL_COMMUNITY): Payer: Self-pay | Admitting: Psychiatry

## 2020-09-24 DIAGNOSIS — F411 Generalized anxiety disorder: Secondary | ICD-10-CM

## 2020-09-24 DIAGNOSIS — F331 Major depressive disorder, recurrent, moderate: Secondary | ICD-10-CM

## 2020-09-24 MED ORDER — ESCITALOPRAM OXALATE 20 MG PO TABS: 20.0000 mg | ORAL_TABLET | Freq: Every day | ORAL | 1 refills | Status: DC
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
## 2020-10-16 NOTE — H&P (Signed)
Patient's anticipated LOS is less than 2 midnights, meeting these requirements: - Younger than 23 - Lives within 1 hour of care - Has a competent adult at home to recover with post-op recover - NO history of  - Chronic pain requiring opiods  - Diabetes  - Coronary Artery Disease  - Heart failure  - Heart attack  - Stroke  - DVT/VTE  - Cardiac arrhythmia  - Respiratory Failure/COPD  - Renal failure  - Anemia  - Advanced Liver disease       Monique Duncan is an 35 y.o. female.    Chief Complaint: left knee pain  HPI: Pt is a 35 y.o. female complaining of left knee pain for multiple months. Pain had continually increased since the beginning. X-rays in the clinic show meniscal tear left knee. Pt has tried various conservative treatments which have failed to alleviate their symptoms, including injections and therapy. Various options are discussed with the patient. Risks, benefits and expectations were discussed with the patient. Patient understand the risks, benefits and expectations and wishes to proceed with surgery.   PCP:  Carlos Levering, PA-C  D/C Plans: Home  PMH: Past Medical History:  Diagnosis Date  . Cellulitis   . Headache     PSH: Past Surgical History:  Procedure Laterality Date  . CHOLECYSTECTOMY N/A 04/11/2014   Procedure: LAPAROSCOPIC CHOLECYSTECTOMY;  Surgeon: Ralene Ok, MD;  Location: [facility];  Service: General;  Laterality: N/A;  . MYRINGOTOMY WITH TUBE PLACEMENT    . naval removal    . TONSILLECTOMY      Social History:  reports that she has never smoked. She has never used smokeless tobacco. She reports that she does not drink alcohol and does not use drugs.  Allergies:  Allergies  Allergen Reactions  . Bee Venom Swelling  . Fentanyl Itching  . 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    Medications: No current facility-administered  medications for this encounter.   Current Outpatient Medications  Medication Sig Dispense Refill  . diphenhydrAMINE (BENADRYL) 25 MG tablet Take 25 mg by mouth daily as needed for allergies.    Marland Kitchen escitalopram (LEXAPRO) 20 MG tablet Take 1 tablet (20 mg total) by mouth daily. 30 tablet 1  . gabapentin (NEURONTIN) 600 MG tablet Take 600 mg by mouth 5 (five) times daily.    Marland Kitchen levothyroxine (SYNTHROID) 125 MCG tablet Take 125 mcg by mouth daily before breakfast.    . Melatonin 5 MG CAPS Take 5 mg by mouth at bedtime.    . methocarbamol (ROBAXIN) 500 MG tablet Take 500 mg by mouth 4 (four) times daily as needed for muscle spasms.    . nortriptyline (PAMELOR) 50 MG capsule Take 50 mg by mouth at bedtime.    . traMADol (ULTRAM) 50 MG tablet Take 50 mg by mouth every 6 (six) hours as needed for moderate pain.      No results found for this or any previous visit (from the past 48 hour(s)). No results found.  ROS: Pain with rom of the left lower extremity  Physical Exam: Alert and oriented 35 y.o. female in no acute distress Cranial nerves 2-12 intact Cervical spine: full rom with no tenderness, nv intact distally Chest: active breath sounds bilaterally, no wheeze rhonchi or rales Heart: regular rate and rhythm, no murmur Abd: non tender non distended with active bowel sounds Hip is stable with rom  Left knee medial pain Antalgic gait No rashes or edema  Assessment/Plan Assessment: left knee meniscal tear   Plan:  Patient will undergo a left knee arthroscopy by Dr. Veverly Fells at [facility] benefits and expectations were discussed with the patient. Patient understand risks, benefits and expectations and wishes to proceed. Preoperative templating of the joint replacement has been completed, documented, and submitted to the Operating Room personnel in order to optimize intra-operative equipment management.   Merla Riches PA-C, [facility] is now [employer] [address] [place], [place], [place] [address] Phone: [phone] [email or web address] Facebook  [employer]

## 2020-10-17 NOTE — Progress Notes (Signed)
DUE TO COVID-19 ONLY ONE VISITOR IS ALLOWED TO COME WITH YOU AND STAY IN THE WAITING ROOM ONLY DURING PRE OP AND PROCEDURE DAY OF SURGERY. THE 1 VISITOR  MAY VISIT WITH YOU AFTER SURGERY IN YOUR PRIVATE ROOM DURING VISITING HOURS ONLY!  YOU NEED TO HAVE A COVID 19 TEST ON___4/5/22____ @_______ , THIS TEST MUST BE DONE BEFORE SURGERY,  COVID TESTING SITE [address] [place] [address], IT IS ON THE RIGHT GOING OUT [address] APPROXIMATELY  2 MINUTES PAST [employer] ON THE RIGHT. ONCE YOUR COVID TEST IS COMPLETED,  PLEASE BEGIN THE QUARANTINE INSTRUCTIONS AS OUTLINED IN YOUR HANDOUT.                Monique Duncan  10/17/2020   Your procedure is scheduled on:         11/01/2020   Report to [facility] Main  Entrance   Report to admitting at      1250pm     Call this number if you have problems the morning of surgery [phone]    REMEMBER: NO  SOLID FOOD CANDY OR GUM AFTER MIDNIGHT. CLEAR LIQUIDS UNTIL   1145am      . NOTHING BY MOUTH EXCEPT CLEAR LIQUIDS UNTIL    1145am     . PLEASE FINISH ENSURE DRINK PER SURGEON ORDER  WHICH NEEDS TO BE COMPLETED AT 1145 am      .      CLEAR LIQUID DIET   Foods Allowed                                                                    Coffee and tea, regular and decaf                            Fruit ices (not with fruit pulp)                                      Iced Popsicles                                    Carbonated beverages, regular and diet                                    Cranberry, grape and apple juices Sports drinks like Gatorade Lightly seasoned clear broth or consume(fat free) Sugar, honey syrup ___________________________________________________________________      BRUSH YOUR TEETH MORNING OF SURGERY AND RINSE YOUR MOUTH OUT, NO CHEWING GUM CANDY OR MINTS.     Take these medicines the morning of surgery with A SIP OF WATER: Synthroid, gabapentin, lexapro  DO NOT TAKE ANY DIABETIC  MEDICATIONS DAY OF YOUR SURGERY                               You may not have any metal on your body including hair pins and  piercings  Do not wear jewelry, make-up, lotions, powders or perfumes, deodorant             Do not wear nail polish on your fingernails.  Do not shave  48 hours prior to surgery.              Men may shave face and neck.   Do not bring valuables to the hospital. [place].  Contacts, dentures or bridgework may not be worn into surgery.  Leave suitcase in the car. After surgery it may be brought to your room.     Patients discharged the day of surgery will not be allowed to drive home. IF YOU ARE HAVING SURGERY AND GOING HOME THE SAME DAY, YOU MUST HAVE AN ADULT TO DRIVE YOU HOME AND BE WITH YOU FOR 24 HOURS. YOU MAY GO HOME BY TAXI OR UBER OR ORTHERWISE, BUT AN ADULT MUST ACCOMPANY YOU HOME AND STAY WITH YOU FOR 24 HOURS.  Name and phone number of your driver:  Special Instructions: N/A              Please read over the following fact sheets you were given: _____________________________________________________________________  [facility] - Preparing for Surgery Before surgery, you can play an important role.  Because skin is not sterile, your skin needs to be as free of germs as possible.  You can reduce the number of germs on your skin by washing with CHG (chlorahexidine gluconate) soap before surgery.  CHG is an antiseptic cleaner which kills germs and bonds with the skin to continue killing germs even after washing. Please DO NOT use if you have an allergy to CHG or antibacterial soaps.  If your skin becomes reddened/irritated stop using the CHG and inform your nurse when you arrive at Short Stay. Do not shave (including legs and underarms) for at least 48 hours prior to the first CHG shower.  You may shave your face/neck. Please follow these instructions carefully:  1.  Shower with CHG Soap the night  before surgery and the  morning of Surgery.  2.  If you choose to wash your hair, wash your hair first as usual with your  normal  shampoo.  3.  After you shampoo, rinse your hair and body thoroughly to remove the  shampoo.                           4.  Use CHG as you would any other liquid soap.  You can apply chg directly  to the skin and wash                       Gently with a scrungie or clean washcloth.  5.  Apply the CHG Soap to your body ONLY FROM THE NECK DOWN.   Do not use on face/ open                           Wound or open sores. Avoid contact with eyes, ears mouth and genitals (private parts).                       Wash face,  Genitals (private parts) with your normal soap.             6.  Wash thoroughly, paying special attention to the area where your surgery  will be performed.  7.  Thoroughly rinse your body with warm water from the neck down.  8.  DO NOT shower/wash with your normal soap after using and rinsing off  the CHG Soap.                9.  Pat yourself dry with a clean towel.            10.  Wear clean pajamas.            11.  Place clean sheets on your bed the night of your first shower and do not  sleep with pets. Day of Surgery : Do not apply any lotions/deodorants the morning of surgery.  Please wear clean clothes to the hospital/surgery center.  FAILURE TO FOLLOW THESE INSTRUCTIONS MAY RESULT IN THE CANCELLATION OF YOUR SURGERY PATIENT SIGNATURE_________________________________  NURSE SIGNATURE__________________________________  ________________________________________________________________________

## 2020-10-21 ENCOUNTER — Ambulatory Visit (HOSPITAL_COMMUNITY): Payer: 59 | Admitting: Licensed Clinical Social Worker

## 2020-10-22 ENCOUNTER — Encounter (HOSPITAL_COMMUNITY): Payer: Self-pay

## 2020-10-22 ENCOUNTER — Other Ambulatory Visit: Payer: Self-pay

## 2020-10-22 ENCOUNTER — Encounter (HOSPITAL_COMMUNITY)
Admission: RE | Admit: 2020-10-22 | Discharge: 2020-10-22 | Disposition: A | Discharge status: Home / Self Care | Payer: 59 | Source: Ambulatory Visit | Attending: Orthopedic Surgery | Admitting: Orthopedic Surgery

## 2020-10-22 DIAGNOSIS — Z01812 Encounter for preprocedural laboratory examination: Secondary | ICD-10-CM | POA: Insufficient documentation

## 2020-10-22 HISTORY — DX: Depression, unspecified: F32.A

## 2020-10-22 HISTORY — DX: Hypothyroidism, unspecified: E03.9

## 2020-10-22 HISTORY — DX: Anxiety disorder, unspecified: F41.9

## 2020-10-22 HISTORY — DX: Sleep apnea, unspecified: G47.30

## 2020-10-22 LAB — CBC
HCT: 43.8 % (ref 36.0–46.0)
Hemoglobin: 13.6 g/dL (ref 12.0–15.0)
MCH: 27.6 pg (ref 26.0–34.0)
MCHC: 31.1 g/dL (ref 30.0–36.0)
MCV: 88.8 fL (ref 80.0–100.0)
Platelets: 255 10*3/uL (ref 150–400)
RBC: 4.93 MIL/uL (ref 3.87–5.11)
RDW: 14.4 % (ref 11.5–15.5)
WBC: 7.9 10*3/uL (ref 4.0–10.5)
nRBC: 0 % (ref 0.0–0.2)

## 2020-10-24 NOTE — Progress Notes (Signed)
Emmali made aware surgery on 10/29/20 arrival at 3:30PM, drink pre surgery drink at 2:30PM, COVID swab at 4/1 at 11:25 AM. Lambert Keto was aware of date and time change verbalized understanding.

## 2020-10-25 ENCOUNTER — Other Ambulatory Visit (HOSPITAL_COMMUNITY)
Admission: RE | Admit: 2020-10-25 | Discharge: 2020-10-25 | Disposition: A | Discharge status: Home / Self Care | Payer: 59 | Source: Ambulatory Visit | Attending: Orthopedic Surgery | Admitting: Orthopedic Surgery

## 2020-10-25 DIAGNOSIS — Z01812 Encounter for preprocedural laboratory examination: Secondary | ICD-10-CM | POA: Insufficient documentation

## 2020-10-25 DIAGNOSIS — Z20822 Contact with and (suspected) exposure to covid-19: Secondary | ICD-10-CM | POA: Insufficient documentation

## 2020-10-26 LAB — SARS CORONAVIRUS 2 (TAT 6-24 HRS): SARS Coronavirus 2: NEGATIVE

## 2020-10-29 ENCOUNTER — Other Ambulatory Visit: Payer: Self-pay

## 2020-10-29 ENCOUNTER — Ambulatory Visit (HOSPITAL_COMMUNITY): Payer: 59 | Admitting: Certified Registered Nurse Anesthetist

## 2020-10-29 ENCOUNTER — Ambulatory Visit (HOSPITAL_COMMUNITY)
Admission: RE | Admit: 2020-10-29 | Discharge: 2020-10-29 | Disposition: A | Discharge status: Home / Self Care | Payer: 59 | Attending: Orthopedic Surgery | Admitting: Orthopedic Surgery

## 2020-10-29 ENCOUNTER — Encounter (HOSPITAL_COMMUNITY): Payer: Self-pay | Admitting: Orthopedic Surgery

## 2020-10-29 ENCOUNTER — Encounter (HOSPITAL_COMMUNITY)
Admission: RE | Disposition: A | Discharge status: Home / Self Care | Payer: Self-pay | Source: Home / Self Care | Attending: Orthopedic Surgery

## 2020-10-29 DIAGNOSIS — Z885 Allergy status to narcotic agent status: Secondary | ICD-10-CM | POA: Insufficient documentation

## 2020-10-29 DIAGNOSIS — Z881 Allergy status to other antibiotic agents status: Secondary | ICD-10-CM | POA: Insufficient documentation

## 2020-10-29 DIAGNOSIS — S83242A Other tear of medial meniscus, current injury, left knee, initial encounter: Secondary | ICD-10-CM | POA: Diagnosis not present

## 2020-10-29 DIAGNOSIS — X58XXXA Exposure to other specified factors, initial encounter: Secondary | ICD-10-CM | POA: Diagnosis not present

## 2020-10-29 DIAGNOSIS — Z9049 Acquired absence of other specified parts of digestive tract: Secondary | ICD-10-CM | POA: Insufficient documentation

## 2020-10-29 DIAGNOSIS — M94262 Chondromalacia, left knee: Secondary | ICD-10-CM | POA: Diagnosis not present

## 2020-10-29 DIAGNOSIS — Z79890 Hormone replacement therapy (postmenopausal): Secondary | ICD-10-CM | POA: Insufficient documentation

## 2020-10-29 DIAGNOSIS — Z79899 Other long term (current) drug therapy: Secondary | ICD-10-CM | POA: Diagnosis not present

## 2020-10-29 HISTORY — PX: KNEE ARTHROSCOPY: SHX127

## 2020-10-29 LAB — PREGNANCY, URINE: Preg Test, Ur: NEGATIVE

## 2020-10-29 SURGERY — ARTHROSCOPY, KNEE
Anesthesia: General | Site: Knee | Laterality: Left

## 2020-10-29 MED ORDER — LIDOCAINE 2% (20 MG/ML) 5 ML SYRINGE
INTRAMUSCULAR | Status: DC | PRN
  Administered 2020-10-29: 100 mg via INTRAVENOUS

## 2020-10-29 MED ORDER — DEXAMETHASONE SODIUM PHOSPHATE 10 MG/ML IJ SOLN
INTRAMUSCULAR | Status: AC
  Filled 2020-10-29: qty 1

## 2020-10-29 MED ORDER — DEXTROSE 5 % IV SOLN
3.0000 g | INTRAVENOUS | Status: AC
  Administered 2020-10-29: 3 g via INTRAVENOUS
  Filled 2020-10-29: qty 3

## 2020-10-29 MED ORDER — OXYCODONE HCL 5 MG PO TABS
ORAL_TABLET | ORAL | Status: AC
  Filled 2020-10-29: qty 1

## 2020-10-29 MED ORDER — LIDOCAINE 2% (20 MG/ML) 5 ML SYRINGE
INTRAMUSCULAR | Status: AC
  Filled 2020-10-29: qty 5

## 2020-10-29 MED ORDER — SODIUM CHLORIDE 0.9 % IR SOLN
Status: DC | PRN
  Administered 2020-10-29: 6000 mL
  Administered 2020-10-29 (×2): 3000 mL

## 2020-10-29 MED ORDER — ONDANSETRON HCL 4 MG PO TABS: 4.0000 mg | ORAL_TABLET | Freq: Three times a day (TID) | ORAL | 1 refills | Status: AC | PRN

## 2020-10-29 MED ORDER — AMISULPRIDE (ANTIEMETIC) 5 MG/2ML IV SOLN: 10.0000 mg | Freq: Once | INTRAVENOUS | Status: DC | PRN

## 2020-10-29 MED ORDER — HYDROMORPHONE HCL 1 MG/ML IJ SOLN
0.2500 mg | INTRAMUSCULAR | Status: DC | PRN
  Administered 2020-10-29 (×4): 0.5 mg via INTRAVENOUS

## 2020-10-29 MED ORDER — MIDAZOLAM HCL 2 MG/2ML IJ SOLN
INTRAMUSCULAR | Status: AC
  Filled 2020-10-29: qty 2

## 2020-10-29 MED ORDER — OXYCODONE-ACETAMINOPHEN 5-325 MG PO TABS: 1.0000 | ORAL_TABLET | ORAL | 0 refills | Status: AC | PRN

## 2020-10-29 MED ORDER — DEXMEDETOMIDINE (PRECEDEX) IN NS 20 MCG/5ML (4 MCG/ML) IV SYRINGE
PREFILLED_SYRINGE | INTRAVENOUS | Status: AC
  Filled 2020-10-29: qty 5

## 2020-10-29 MED ORDER — FENTANYL CITRATE (PF) 100 MCG/2ML IJ SOLN
INTRAMUSCULAR | Status: DC | PRN
  Administered 2020-10-29 (×4): 50 ug via INTRAVENOUS

## 2020-10-29 MED ORDER — BUPIVACAINE-EPINEPHRINE 0.25% -1:200000 IJ SOLN
INTRAMUSCULAR | Status: DC | PRN
  Administered 2020-10-29: 30 mL

## 2020-10-29 MED ORDER — OXYCODONE HCL 5 MG/5ML PO SOLN: 5.0000 mg | Freq: Once | ORAL | Status: AC | PRN

## 2020-10-29 MED ORDER — OXYCODONE HCL 5 MG PO TABS
5.0000 mg | ORAL_TABLET | Freq: Once | ORAL | Status: AC | PRN
  Administered 2020-10-29: 5 mg via ORAL

## 2020-10-29 MED ORDER — DEXAMETHASONE SODIUM PHOSPHATE 10 MG/ML IJ SOLN
INTRAMUSCULAR | Status: DC | PRN
  Administered 2020-10-29: 10 mg via INTRAVENOUS

## 2020-10-29 MED ORDER — MIDAZOLAM HCL 2 MG/2ML IJ SOLN
INTRAMUSCULAR | Status: DC | PRN
  Administered 2020-10-29: 2 mg via INTRAVENOUS

## 2020-10-29 MED ORDER — ENOXAPARIN SODIUM 40 MG/0.4ML ~~LOC~~ SOLN: 80.0000 mg | SUBCUTANEOUS | 0 refills | Status: DC

## 2020-10-29 MED ORDER — LACTATED RINGERS IV SOLN: INTRAVENOUS | Status: DC

## 2020-10-29 MED ORDER — FENTANYL CITRATE (PF) 100 MCG/2ML IJ SOLN
INTRAMUSCULAR | Status: AC
  Filled 2020-10-29: qty 2

## 2020-10-29 MED ORDER — HYDROMORPHONE HCL 1 MG/ML IJ SOLN
INTRAMUSCULAR | Status: AC
  Filled 2020-10-29: qty 1

## 2020-10-29 MED ORDER — CHLORHEXIDINE GLUCONATE 0.12 % MT SOLN
15.0000 mL | Freq: Once | OROMUCOSAL | Status: AC
  Administered 2020-10-29: 15 mL via OROMUCOSAL

## 2020-10-29 MED ORDER — METHOCARBAMOL 500 MG PO TABS: 500.0000 mg | ORAL_TABLET | Freq: Three times a day (TID) | ORAL | 1 refills | Status: AC | PRN

## 2020-10-29 MED ORDER — ORAL CARE MOUTH RINSE: 15.0000 mL | Freq: Once | OROMUCOSAL | Status: AC

## 2020-10-29 MED ORDER — DEXMEDETOMIDINE (PRECEDEX) IN NS 20 MCG/5ML (4 MCG/ML) IV SYRINGE
PREFILLED_SYRINGE | INTRAVENOUS | Status: DC | PRN
  Administered 2020-10-29: 12 ug via INTRAVENOUS
  Administered 2020-10-29: 8 ug via INTRAVENOUS

## 2020-10-29 MED ORDER — BUPIVACAINE-EPINEPHRINE (PF) 0.25% -1:200000 IJ SOLN
INTRAMUSCULAR | Status: AC
  Filled 2020-10-29: qty 30

## 2020-10-29 MED ORDER — ONDANSETRON HCL 4 MG/2ML IJ SOLN
INTRAMUSCULAR | Status: AC
  Filled 2020-10-29: qty 2

## 2020-10-29 MED ORDER — PROPOFOL 500 MG/50ML IV EMUL
INTRAVENOUS | Status: DC | PRN
  Administered 2020-10-29: 200 mg via INTRAVENOUS
  Administered 2020-10-29: 50 mg via INTRAVENOUS
  Administered 2020-10-29: 30 mg via INTRAVENOUS

## 2020-10-29 MED ORDER — PROPOFOL 10 MG/ML IV BOLUS
INTRAVENOUS | Status: AC
  Filled 2020-10-29: qty 20

## 2020-10-29 MED ORDER — ONDANSETRON HCL 4 MG/2ML IJ SOLN: 4.0000 mg | Freq: Once | INTRAMUSCULAR | Status: DC | PRN

## 2020-10-29 SURGICAL SUPPLY — 34 items
BNDG ELASTIC 4X5.8 VLCR STR LF (GAUZE/BANDAGES/DRESSINGS) ×2 IMPLANT
BNDG ELASTIC 6X5.8 VLCR STR LF (GAUZE/BANDAGES/DRESSINGS) ×2 IMPLANT
BURR OVAL 8 FLU 4.0X13 (MISCELLANEOUS) ×2 IMPLANT
COVER SURGICAL LIGHT HANDLE (MISCELLANEOUS) ×2 IMPLANT
DISSECTOR  3.8MM X 13CM (MISCELLANEOUS) ×2
DISSECTOR 3.5MM X 13CM (MISCELLANEOUS) ×1 IMPLANT
DISSECTOR 3.8MM X 13CM (MISCELLANEOUS) IMPLANT
DISSECTOR 4.0MM X 13CM (MISCELLANEOUS) IMPLANT
DRAPE U-SHAPE 47X51 STRL (DRAPES) ×2 IMPLANT
DRSG PAD ABDOMINAL 8X10 ST (GAUZE/BANDAGES/DRESSINGS) ×2 IMPLANT
DURAPREP 26ML APPLICATOR (WOUND CARE) ×2 IMPLANT
ELECT REM PT RETURN 15FT ADLT (MISCELLANEOUS) ×2 IMPLANT
GAUZE SPONGE 4X4 12PLY STRL (GAUZE/BANDAGES/DRESSINGS) ×2 IMPLANT
GLOVE ORTHO TXT STRL SZ7.5 (GLOVE) ×2 IMPLANT
GLOVE SURG ORTHO LTX SZ8.5 (GLOVE) ×2 IMPLANT
GLOVE SURG POLY ORTHO LF SZ8 (GLOVE) ×2 IMPLANT
GLOVE SURG UNDER POLY LF SZ7.5 (GLOVE) ×2 IMPLANT
GOWN STRL REUS W/TWL XL LVL3 (GOWN DISPOSABLE) ×2 IMPLANT
KIT BASIN OR (CUSTOM PROCEDURE TRAY) ×2 IMPLANT
KIT TURNOVER KIT A (KITS) ×2 IMPLANT
MANIFOLD NEPTUNE II (INSTRUMENTS) ×2 IMPLANT
NDL SPNL 18GX3.5 QUINCKE PK (NEEDLE) IMPLANT
NEEDLE SPNL 18GX3.5 QUINCKE PK (NEEDLE) ×2 IMPLANT
PACK ARTHROSCOPY WL (CUSTOM PROCEDURE TRAY) ×2 IMPLANT
PADDING CAST COTTON 6X4 STRL (CAST SUPPLIES) ×2 IMPLANT
PENCIL SMOKE EVACUATOR (MISCELLANEOUS) IMPLANT
PORT APPOLLO RF 90DEGREE MULTI (SURGICAL WAND) IMPLANT
PROTECTOR NERVE ULNAR (MISCELLANEOUS) ×2 IMPLANT
STRIP CLOSURE SKIN 1/2X4 (GAUZE/BANDAGES/DRESSINGS) ×2 IMPLANT
SUT ETHILON 3 0 PS 1 (SUTURE) ×2 IMPLANT
SUT MNCRL AB 4-0 PS2 18 (SUTURE) ×2 IMPLANT
TOWEL OR 17X26 10 PK STRL BLUE (TOWEL DISPOSABLE) ×2 IMPLANT
TUBING ARTHROSCOPY IRRIG 16FT (MISCELLANEOUS) ×2 IMPLANT
WRAP KNEE MAXI GEL POST OP (GAUZE/BANDAGES/DRESSINGS) ×2 IMPLANT

## 2020-10-29 NOTE — Anesthesia Preprocedure Evaluation (Addendum)
Anesthesia Evaluation  Patient identified by MRN, date of birth, ID band Patient awake    Reviewed: Allergy & Precautions, NPO status , Patient's Chart, lab work & pertinent test results  History of Anesthesia Complications Negative for: history of anesthetic complications  Airway Mallampati: III  TM Distance: >3 FB Neck ROM: Full    Dental  (+) Teeth Intact   Pulmonary sleep apnea ,    Pulmonary exam normal        Cardiovascular negative cardio ROS Normal cardiovascular exam     Neuro/Psych Anxiety Depression negative neurological ROS     GI/Hepatic negative GI ROS, Neg liver ROS,   Endo/Other  Hypothyroidism Morbid obesity (BMI 65)  Renal/GU negative Renal ROS  negative genitourinary   Musculoskeletal negative musculoskeletal ROS (+)   Abdominal   Peds  Hematology negative hematology ROS (+)   Anesthesia Other Findings   Reproductive/Obstetrics                          Anesthesia Physical Anesthesia Plan  ASA: III  Anesthesia Plan: General   Post-op Pain Management:    Induction: Intravenous  PONV Risk Score and Plan: 3 and Ondansetron, Dexamethasone, Midazolam and Treatment may vary due to age or medical condition  Airway Management Planned: LMA and Oral ETT  Additional Equipment: None  Intra-op Plan:   Post-operative Plan: Extubation in OR  Informed Consent: I have reviewed the patients History and Physical, chart, labs and discussed the procedure including the risks, benefits and alternatives for the proposed anesthesia with the patient or authorized representative who has indicated his/her understanding and acceptance.     Dental advisory given  Plan Discussed with:   Anesthesia Plan Comments:        Anesthesia Quick Evaluation

## 2020-10-29 NOTE — Interval H&P Note (Signed)
History and Physical Interval Note:  10/29/2020 5:53 PM  Monique Duncan  has presented today for surgery, with the diagnosis of Left knee pain.  The various methods of treatment have been discussed with the patient and family. After consideration of risks, benefits and other options for treatment, the patient has consented to  Procedure(s): ARTHROSCOPY KNEE (Left) as a surgical intervention.  The patient's history has been reviewed, patient examined, no change in status, stable for surgery.  I have reviewed the patient's chart and labs.  Questions were answered to the patient's satisfaction.     Monique Duncan

## 2020-10-29 NOTE — Anesthesia Postprocedure Evaluation (Signed)
Anesthesia Post Note  Patient: Monique Duncan  Procedure(s) Performed: ARTHROSCOPY KNEE (Left Knee)     Patient location during evaluation: PACU Anesthesia Type: General Level of consciousness: awake and alert Pain management: pain level controlled Vital Signs Assessment: post-procedure vital signs reviewed and stable Respiratory status: spontaneous breathing, nonlabored ventilation and respiratory function stable Cardiovascular status: blood pressure returned to baseline and stable Postop Assessment: no apparent nausea or vomiting Anesthetic complications: no   No complications documented.  Last Vitals:  Vitals:   10/29/20 1602 10/29/20 1923  BP: 135/77 (!) 164/97  Pulse: 100 (!) 102  Resp: (!) 28 (!) 32  Temp: 36.7 C 37.1 C  SpO2: 96% 100%    Last Pain:  Vitals:   10/29/20 1930  TempSrc:   PainSc: 2                  Lidia Collum

## 2020-10-29 NOTE — Transfer of Care (Signed)
Immediate Anesthesia Transfer of Care Note  Patient: Monique Duncan  Procedure(s) Performed: Procedure(s): ARTHROSCOPY KNEE (Left)  Patient Location: PACU  Anesthesia Type:General  Level of Consciousness: Alert, Awake, Oriented  Airway & Oxygen Therapy: Patient Spontanous Breathing  Post-op Assessment: Report given to RN  Post vital signs: Reviewed and stable  Last Vitals:  Vitals:   10/29/20 1602  BP: 135/77  Pulse: 100  Resp: (!) 28  Temp: 36.7 C  SpO2: 66%    Complications: No apparent anesthesia complications

## 2020-10-29 NOTE — Discharge Instructions (Signed)
Ice to the knee constantly.  Keep the incision covered and clean and dry for one week, then ok to get it wet in the shower. Change the gauze bandages if they become saturated with gauze and tape  Do exercise as instructed every hour, please to prevent stiffness and to prevent blood clots  Ankle pumps - move the ankle up and down  Heel slides - bend the knee by sliding your heel toward your bottom and then back out with the knee straight back and forth  Quad sets - push your knee down with your thigh muscle making your knee stiff and straight. Once you can do this well, do straight leg raises   Do exercises for 5 minutes of each hour.      DO NOT prop anything under the knee, it will make your knee stiff. When elevating use a pillow under the lower leg.   Use the walker while you are up and around for balance.  Please take some weight off of the left leg this week using the walker for support and then next week, progress your weight as you feel comfortable.   Start on the Lovenox injections tomorrow evening (Wednesday 10/30/20) to prevent blood clots, once per day in the evenings for 30 days   also change positions and rooms throughout your day, stay active!!  Follow up with Dr Veverly Fells in two weeks in the office, call [phone] for appt   General Anesthesia, Adult, Care After This sheet gives you information about how to care for yourself after your procedure. Your health care provider may also give you more specific instructions. If you have problems or questions, contact your health care provider. What can I expect after the procedure? After the procedure, the following side effects are common:  Pain or discomfort at the IV site.  Nausea.  Vomiting.  Sore throat.  Trouble concentrating.  Feeling cold or chills.  Feeling weak or tired.  Sleepiness and fatigue.  Soreness and body aches. These side effects can affect parts of the body that were not involved in  surgery. Follow these instructions at home: For the time period you were told by your health care provider:  Rest.  Do not participate in activities where you could fall or become injured.  Do not drive or use machinery.  Do not drink alcohol.  Do not take sleeping pills or medicines that cause drowsiness.  Do not make important decisions or sign legal documents.  Do not take care of children on your own.   Eating and drinking  Follow any instructions from your health care provider about eating or drinking restrictions.  When you feel hungry, start by eating small amounts of foods that are soft and easy to digest (bland), such as toast. Gradually return to your regular diet.  Drink enough fluid to keep your urine pale yellow.  If you vomit, rehydrate by drinking water, juice, or clear broth. General instructions  If you have sleep apnea, surgery and certain medicines can increase your risk for breathing problems. Follow instructions from your health care provider about wearing your sleep device: ? Anytime you are sleeping, including during daytime naps. ? While taking prescription pain medicines, sleeping medicines, or medicines that make you drowsy.  Have a responsible adult stay with you for the time you are told. It is important to have someone help care for you until you are awake and alert.  Return to your normal activities as told by your health  care provider. Ask your health care provider what activities are safe for you.  Take over-the-counter and prescription medicines only as told by your health care provider.  If you smoke, do not smoke without supervision.  Keep all follow-up visits as told by your health care provider. This is important. Contact a health care provider if:  You have nausea or vomiting that does not get better with medicine.  You cannot eat or drink without vomiting.  You have pain that does not get better with medicine.  You are unable to  pass urine.  You develop a skin rash.  You have a fever.  You have redness around your IV site that gets worse. Get help right away if:  You have difficulty breathing.  You have chest pain.  You have blood in your urine or stool, or you vomit blood. Summary  After the procedure, it is common to have a sore throat or nausea. It is also common to feel tired.  Have a responsible adult stay with you for the time you are told. It is important to have someone help care for you until you are awake and alert.  When you feel hungry, start by eating small amounts of foods that are soft and easy to digest (bland), such as toast. Gradually return to your regular diet.  Drink enough fluid to keep your urine pale yellow.  Return to your normal activities as told by your health care provider. Ask your health care provider what activities are safe for you. This information is not intended to replace advice given to you by your health care provider. Make sure you discuss any questions you have with your health care provider. Document Revised: 03/28/2020 Document Reviewed: 10/26/2019 Elsevier Patient Education  2021 [employer].

## 2020-10-29 NOTE — Anesthesia Procedure Notes (Signed)
Procedure Name: LMA Insertion Date/Time: 10/29/2020 6:04 PM Performed by: Gerald Leitz, CRNA Pre-anesthesia Checklist: Patient identified, Patient being monitored, Timeout performed, Emergency Drugs available and Suction available Patient Re-evaluated:Patient Re-evaluated prior to induction Oxygen Delivery Method: Circle system utilized Preoxygenation: Pre-oxygenation with 100% oxygen Induction Type: IV induction Ventilation: Mask ventilation without difficulty LMA: LMA inserted LMA Size: 4.0 Tube type: Oral Number of attempts: 1 Placement Confirmation: positive ETCO2 and breath sounds checked- equal and bilateral Tube secured with: Tape Dental Injury: Teeth and Oropharynx as per pre-operative assessment

## 2020-10-29 NOTE — Brief Op Note (Signed)
10/29/2020  7:34 PM  PATIENT:  Monique Duncan  35 y.o. female  PRE-OPERATIVE DIAGNOSIS:  Left knee pain, medial meniscus tear, chondromalacia  POST-OPERATIVE DIAGNOSIS:  Left knee pain, medial meniscus tear, chondromalacia  PROCEDURE:  Procedure(s): ARTHROSCOPY KNEE (Left) , partial medial meniscectomy, chondroplasty (debridement) medial and patellofemoral  SURGEON:  Surgeon(s) and Role:    Netta Cedars, MD - Primary  PHYSICIAN ASSISTANT:   ASSISTANTS: none   ANESTHESIA:   general  EBL:  5 mL   BLOOD ADMINISTERED:none  DRAINS: none   LOCAL MEDICATIONS USED:  MARCAINE     SPECIMEN:  No Specimen  DISPOSITION OF SPECIMEN:  N/A  COUNTS:  YES  TOURNIQUET:  * No tourniquets in log *  DICTATION: .Other Dictation: Dictation Number [id number]  PLAN OF CARE: Discharge to home after PACU  PATIENT DISPOSITION:  PACU - hemodynamically stable.   Delay start of Pharmacological VTE agent (>24hrs) due to surgical blood loss or risk of bleeding: no

## 2020-10-30 ENCOUNTER — Encounter (HOSPITAL_COMMUNITY): Payer: Self-pay | Admitting: Orthopedic Surgery

## 2020-10-30 NOTE — Op Note (Signed)
Monique Duncan, BEGGS MEDICAL RECORD NO: [id number] ACCOUNT NO: [id number] DATE OF BIRTH: 20-Jul-1986 FACILITY: [facility] LOCATION: [facility] PHYSICIAN: Doran Heater. Veverly Fells, MD  Operative Report   DATE OF PROCEDURE: 10/29/2020  PREOPERATIVE DIAGNOSES:  Left knee medial meniscus tear and chondromalacia.  POSTOPERATIVE DIAGNOSES:  Left knee medial meniscus tear and chondromalacia.  PROCEDURE PERFORMED:  Left knee arthroscopy with partial medial meniscectomy and chondroplasty debridement type, medial compartment and patellofemoral compartment.  ATTENDING SURGEON:  Esmond Plants, MD  ASSISTANT:  None.  ANESTHESIA:  LMA general anesthesia was used.  ESTIMATED BLOOD LOSS:  Minimal.  FLUID REPLACEMENT:  1000 mL crystalloid.  Instrument count was correct.  There were no complications.  Perioperative antibiotics were given.  INDICATIONS:  The patient is a 35 year old female with a history of worsening left medial knee pain.  The patient has had an extended period of conservative management including modification of activities, home exercises and injections.  The patient  presents now with suspected medial meniscus tear for diagnostic/therapeutic knee arthroscopy with potential meniscectomy.  Risks including but not limited to infection and blood clots were discussed with the patient.  Informed consent obtained.  DESCRIPTION OF PROCEDURE:  After an adequate level of anesthesia was achieved, the patient positioned supine on the operating room table.  Left leg correctly identified, a lateral post was utilized.  Sterile prep and drape of the left leg was performed.   Timeout called, verifying correct patient, correct site.  We utilized standard arthroscopic portals including superolateral outflow, anterolateral scope and anteromedial working portals.  We identified significant synovitis throughout the knee joint.   Severe patellofemoral chondromalacia noted, mostly trochlear wear in the central aspect of  the trochlea.  The patella had some grade III chondromalacia with some fibrillation and loose flaps.  This was not full thickness wear and we used tangential  chondroplasty technique with a shaver to remove unstable cartilage.  The trochlea had more near full thickness wear with unstable flaps on the lateral side, on the lateral flare and this involved about a 1.5 x 1.5 cm area.  We did remove the unstable  cartilage at the margins of the nearly full thickness trochlear defect.  We inspected the medial and lateral gutters, no loose bodies identified.  Patellar tracking grossly normal.  We did place the scope into the medial compartment.  There was a  posterior horn meniscus tear identified, using a meniscal probe with basket forceps and a motorized shaver, removed the torn area, which was basically a horizontal cleavage tear posteriorly and this involved about 25% of the posterior horn of the  meniscus.  Once we had debrided back to stable meniscal rim, we made sure that meniscal root integrity was intact, which it was.  There was also grade III chondromalacia on the weightbearing surface of the medial femoral condyle, requiring tangential  chondroplasty.  There was loose flaps and fibrillation, which were removed with tangential technique.  The tibia showed grade III changes, but no chondroplasty performed there.  ACL and PCL intact.  Lateral compartment pristine including articular  cartilage and the meniscus as probed and visualized from both medial and lateral portals.  We had a good look and no visual issues at all in the knee and felt like we accomplished what we needed to with the chondroplasty in two compartments and the  partial meniscectomy.  We sutured the wounds with nylon, injected Marcaine at the end of surgery to help with postoperative pain control and applied sterile bandages. The patient transported  to recovery room in stable condition.   SHW D: 10/29/2020 7:40:06 pm T: 10/30/2020  2:23:00 am  JOB: [id number]/ [id number]

## 2020-11-26 ENCOUNTER — Ambulatory Visit (INDEPENDENT_AMBULATORY_CARE_PROVIDER_SITE_OTHER): Payer: 59 | Admitting: Licensed Clinical Social Worker

## 2020-11-26 DIAGNOSIS — F411 Generalized anxiety disorder: Secondary | ICD-10-CM
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
## 2020-12-10 ENCOUNTER — Ambulatory Visit (INDEPENDENT_AMBULATORY_CARE_PROVIDER_SITE_OTHER): Payer: 59 | Admitting: Licensed Clinical Social Worker

## 2020-12-10 DIAGNOSIS — F411 Generalized anxiety disorder: Secondary | ICD-10-CM | POA: Diagnosis not present
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
## 2020-12-18 ENCOUNTER — Telehealth (HOSPITAL_COMMUNITY): Payer: Self-pay | Admitting: Psychiatry

## 2020-12-18 NOTE — Telephone Encounter (Signed)
Pt is requesting a letter for court.  Needs to state her diagnosis, what meds she is on and why.  Call patient when letter is complete. She may come pick it up   [phone]

## 2020-12-18 NOTE — Telephone Encounter (Signed)
Letter made, will sign

## 2020-12-18 NOTE — Telephone Encounter (Signed)
Letter made.

## 2020-12-19 NOTE — Telephone Encounter (Signed)
Called Monique Duncan and LM.  Letter is ready and at front desk.  We can email, mail, or she can pick up.

## 2020-12-31 ENCOUNTER — Ambulatory Visit (INDEPENDENT_AMBULATORY_CARE_PROVIDER_SITE_OTHER): Payer: 59 | Admitting: Psychiatry

## 2020-12-31 ENCOUNTER — Encounter (HOSPITAL_COMMUNITY): Payer: Self-pay | Admitting: Psychiatry

## 2020-12-31 VITALS — BP 128/78 | HR 97 | Ht 63.0 in | Wt 370.0 lb

## 2020-12-31 DIAGNOSIS — F331 Major depressive disorder, recurrent, moderate: Secondary | ICD-10-CM

## 2020-12-31 DIAGNOSIS — F4310 Post-traumatic stress disorder, unspecified: Secondary | ICD-10-CM | POA: Diagnosis not present

## 2020-12-31 DIAGNOSIS — F411 Generalized anxiety disorder: Secondary | ICD-10-CM | POA: Diagnosis not present

## 2020-12-31 MED ORDER — ESCITALOPRAM OXALATE 20 MG PO TABS: 20.0000 mg | ORAL_TABLET | Freq: Every day | ORAL | 1 refills | Status: DC
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
## 2021-02-10 ENCOUNTER — Ambulatory Visit (INDEPENDENT_AMBULATORY_CARE_PROVIDER_SITE_OTHER): Payer: 59 | Admitting: Licensed Clinical Social Worker

## 2021-02-10 DIAGNOSIS — F411 Generalized anxiety disorder: Secondary | ICD-10-CM | POA: Diagnosis not present

## 2021-02-11 NOTE — Progress Notes (Signed)
THERAPIST PROGRESS NOTE  Session Time: 3:00 pm-3:45 pm  Type of Therapy: Individual Therapy  Session #16  Purpose of Session/Treatment Goals addressed: "Tykeisha will manage mood and anxiety as evidenced by reducing irritability, coping with stressors, manage anxious thoughts and physical aspects of anxiety, and express emotions appropriately for 5 out of 7 days for 60 days."  Interventions:  Therapist utilized CBT and Solution Focused brief therapy to address anxiety and mood. Therapist provided support and empathy to patient during session. Therapist explored patient's feelings about prison and how she is coping with it.    Effectiveness: Patient was oriented x5 (person, place, situation, time, and object). Patient was casually dressed, and appropriately groomed. Patient was alert, engaged, pleasant, and cooperative. Patient will more than likely be going to prison next month. She shared her concerns about going, her fears, and how it will impact her as well as her family. Patient is getting herself in the headspace to deal with going to prison by shutting herself off and not expecting her court date to change anything though there is a chance her sentence could be different.   Patient engaged in session. She responded well to interventions. Patient continues to meet criteria for Major Depressive disorder, recurrent episode, moderate and Generalized Anxiety Disorder. Patient will continue in outpatient therapy due to being the least restrictive service to meet her needs. Patient made moderate progress on her goals at this time.   Suicidal/Homicidal: Negativewithout intent/plan  Plan: Return again in 2-4 weeks.   Diagnosis: Axis I: Generalized Anxiety Disorder and MDD (major depressive disorder), recurrent episode, moderate    Axis II: No diagnosis   Glori Bickers, LCSW 02/11/2021

## 2021-02-25 ENCOUNTER — Ambulatory Visit (INDEPENDENT_AMBULATORY_CARE_PROVIDER_SITE_OTHER): Payer: 59 | Admitting: Licensed Clinical Social Worker

## 2021-02-25 DIAGNOSIS — F411 Generalized anxiety disorder: Secondary | ICD-10-CM

## 2021-02-26 NOTE — Progress Notes (Signed)
THERAPIST PROGRESS NOTE  Session Time: 1:00 pm-1:45 pm  Type of Therapy: Individual Therapy  Session #17  Purpose of Session/Treatment Goals addressed: "Salimah will manage mood and anxiety as evidenced by reducing irritability, coping with stressors, manage anxious thoughts and physical aspects of anxiety, and express emotions appropriately for 5 out of 7 days for 60 days."  Interventions:  Therapist utilized CBT and Solution Focused brief therapy to address anxiety and mood. Therapist provided support and empathy to patient during session. Therapist discussed with patient her feelings about serving time, and how she is mentally preparing for it.    Effectiveness: Patient was oriented x5 (person, place, situation, time, and object). Patient was casually dressed, and appropriately groomed. Patient was alert, engaged, pleasant, and cooperative. Patient shared similar feelings as previous session. She is frustrated and hurt that she is going to serve time. She doesn't feel like with the amount of health issues she has that she would not be appropriate for prison. She has accepted that she is going to serve.   Patient engaged in session. She responded well to interventions. Patient continues to meet criteria for Major Depressive disorder, recurrent episode, moderate and Generalized Anxiety Disorder. Patient will continue in outpatient therapy due to being the least restrictive service to meet her needs. Patient made moderate progress on her goals at this time.   Suicidal/Homicidal: Negativewithout intent/plan  Plan: Return again in 2-4 weeks.   Diagnosis: Axis I: Generalized Anxiety Disorder and MDD (major depressive disorder), recurrent episode, moderate    Axis II: No diagnosis   Glori Bickers, LCSW 02/26/2021

## 2021-03-11 ENCOUNTER — Ambulatory Visit (HOSPITAL_COMMUNITY): Payer: 59 | Admitting: Licensed Clinical Social Worker

## 2021-03-27 ENCOUNTER — Telehealth (HOSPITAL_COMMUNITY): Payer: 59 | Admitting: Psychiatry

## 2024-03-16 ENCOUNTER — Other Ambulatory Visit: Payer: Self-pay | Admitting: Family Medicine

## 2024-03-16 DIAGNOSIS — O161 Unspecified maternal hypertension, first trimester: Secondary | ICD-10-CM | POA: Insufficient documentation

## 2024-03-16 DIAGNOSIS — E139 Other specified diabetes mellitus without complications: Secondary | ICD-10-CM | POA: Insufficient documentation

## 2024-03-16 DIAGNOSIS — R7303 Prediabetes: Secondary | ICD-10-CM | POA: Insufficient documentation

## 2024-03-16 DIAGNOSIS — G9332 Myalgic encephalomyelitis/chronic fatigue syndrome: Secondary | ICD-10-CM | POA: Insufficient documentation

## 2024-03-16 DIAGNOSIS — J309 Allergic rhinitis, unspecified: Secondary | ICD-10-CM | POA: Insufficient documentation

## 2024-03-17 ENCOUNTER — Encounter: Payer: Self-pay | Admitting: Family Medicine

## 2024-03-17 ENCOUNTER — Ambulatory Visit: Admitting: Family Medicine

## 2024-03-17 ENCOUNTER — Ambulatory Visit (INDEPENDENT_AMBULATORY_CARE_PROVIDER_SITE_OTHER): Admitting: Licensed Clinical Social Worker

## 2024-03-17 VITALS — BP 120/82 | HR 65 | Temp 97.6°F | Resp 18 | Ht 63.0 in | Wt 304.9 lb

## 2024-03-17 DIAGNOSIS — R7302 Impaired glucose tolerance (oral): Secondary | ICD-10-CM

## 2024-03-17 DIAGNOSIS — F411 Generalized anxiety disorder: Secondary | ICD-10-CM | POA: Diagnosis not present

## 2024-03-17 DIAGNOSIS — F418 Other specified anxiety disorders: Secondary | ICD-10-CM | POA: Diagnosis not present

## 2024-03-17 DIAGNOSIS — F338 Other recurrent depressive disorders: Secondary | ICD-10-CM | POA: Diagnosis not present

## 2024-03-17 DIAGNOSIS — J309 Allergic rhinitis, unspecified: Secondary | ICD-10-CM | POA: Diagnosis not present

## 2024-03-17 DIAGNOSIS — E039 Hypothyroidism, unspecified: Secondary | ICD-10-CM | POA: Diagnosis not present

## 2024-03-17 DIAGNOSIS — Z7689 Persons encountering health services in other specified circumstances: Secondary | ICD-10-CM

## 2024-03-17 MED ORDER — MONTELUKAST SODIUM 10 MG PO TABS: 10.0000 mg | ORAL_TABLET | Freq: Every day | ORAL | 3 refills | Status: DC

## 2024-03-17 MED ORDER — LEVOTHYROXINE SODIUM 100 MCG PO TABS: 100.0000 ug | ORAL_TABLET | Freq: Every day | ORAL | 0 refills | Status: AC

## 2024-03-17 NOTE — Progress Notes (Signed)
 New Patient Office Visit  Subjective    Patient ID: Monique Duncan, female    DOB: [birth year]  Age: 38 y.o. MRN: [id number]  CC:  Chief Complaint  Patient presents with   Establish Care    Patient is here to establish care with a new PCP    HPI Monique Duncan presents to establish care. She is here with mother Monique Duncan. She reports she recently got released from prison.   Pt has hx of hypothyroidism and is taking Levothyroxine  100mcg daily. She needs this refilled.  She has a psychiatrist Monique Duncan for her mental health condition (depression/anxiety) She has hx of allergic rhinitis and taking Zyrtec. This isn't working and she still has mucus in her throat.  Pt has hx of prediabetes. Its been a while since she has had this checked.   Outpatient Encounter Medications as of 03/17/2024  Medication Sig   Acetaminophen  (ACETAMIN PO) Take by mouth.   cetirizine (ZYRTEC) 10 MG chewable tablet Chew 10 mg by mouth daily.   DULoxetine (CYMBALTA) 30 MG capsule Take 30 mg by mouth daily.   hydrOXYzine (VISTARIL) 50 MG capsule Take 50 mg by mouth 3 (three) times daily as needed.   lamoTRIgine (LAMICTAL) 100 MG tablet Take 100 mg by mouth daily.   levothyroxine  (SYNTHROID ) 100 MCG tablet Take 100 mcg by mouth daily before breakfast.   Lidocaine  HCl (LIDOCAINE  1%/NA BICARB 0.1 MEQ) INJ injection Inject into the skin once.   melatonin 3 MG TABS tablet Take 3 mg by mouth at bedtime.   Melatonin 5 MG CAPS Take 5 mg by mouth at bedtime.   Menthol-Methyl Salicylate (EQL MUSCLE RUB PAIN RELIEVING) 10-15 % CREA Apply topically.   methocarbamol  (ROBAXIN ) 500 MG tablet Take 1 tablet (500 mg total) by mouth every 8 (eight) hours as needed for muscle spasms.   naproxen (NAPROSYN) 500 MG tablet Take 500 mg by mouth 2 (two) times daily with a meal.   nortriptyline (PAMELOR) 50 MG capsule Take 50 mg by mouth at bedtime.   sertraline (ZOLOFT) 50 MG tablet Take 50 mg by mouth daily.   SUMAtriptan  (IMITREX) 100 MG tablet Take 100 mg by mouth every 2 (two) hours as needed for migraine. May repeat in 2 hours if headache persists or recurs.   traMADol (ULTRAM) 50 MG tablet Take 50 mg by mouth every 6 (six) hours as needed for moderate pain.   enoxaparin  (LOVENOX ) 40 MG/0.4ML injection Inject 0.8 mLs (80 mg total) into the skin daily. 30 days post op   escitalopram  (LEXAPRO ) 20 MG tablet Take 1 tablet (20 mg total) by mouth daily.   gabapentin (NEURONTIN) 600 MG tablet Take 600 mg by mouth 5 (five) times daily.   levothyroxine  (SYNTHROID ) 125 MCG tablet Take 125 mcg by mouth daily before breakfast.   No facility-administered encounter medications on file as of 03/17/2024.    Past Medical History:  Diagnosis Date   Anxiety    Cellulitis    Depression    Headache    pt denies    Hypothyroidism    Sleep apnea    mild no cpap     Past Surgical History:  Procedure Laterality Date   CHOLECYSTECTOMY N/A 04/11/2014   Procedure: LAPAROSCOPIC CHOLECYSTECTOMY;  Surgeon: Lynda Leos, MD;  Location: [facility];  Service: General;  Laterality: N/A;   KNEE ARTHROSCOPY Left 10/29/2020   Procedure: ARTHROSCOPY KNEE;  Surgeon: Kay Kemps, MD;  Location: [facility];  Service: Orthopedics;  Laterality: Left;  MYRINGOTOMY WITH TUBE PLACEMENT     naval removal     TONSILLECTOMY     UTERINE FIBROID SURGERY      Family History  Problem Relation Age of Onset   Depression Mother    Migraines Mother    Breast cancer Maternal Grandmother    Diabetes Maternal Grandfather    Leukemia Paternal Grandfather     Social History   Socioeconomic History   Marital status: Single    Spouse name: Not on file   Number of children: Not on file   Years of education: Not on file   Highest education level: Not on file  Occupational History   Not on file  Tobacco Use   Smoking status: Never    Passive exposure: Current   Smokeless tobacco: Never  Vaping Use   Vaping status: Never Used  Substance and  Sexual Activity   Alcohol use: No    Alcohol/week: 0.0 standard drinks of alcohol   Drug use: No   Sexual activity: Not Currently    Partners: Male  Other Topics Concern   Not on file  Social History Narrative   Not on file   Social Drivers of Health   Financial Resource Strain: Not on file  Food Insecurity: No Food Insecurity (10/10/2020)   Received from [facility]   Hunger Vital Sign    Within the past 12 months, you worried that your food would run out before you got the money to buy more.: Never true    Within the past 12 months, the food you bought just didn't last and you didn't have money to get more.: Never true  Transportation Needs: Not on file  Physical Activity: Not on file  Stress: Not on file  Social Connections: Unknown (12/07/2021)   Received from [facility]   Social Network    Social Network: Not on file  Intimate Partner Violence: Unknown (10/30/2021)   Received from [facility]   HITS    Physically Hurt: Not on file    Insult or Talk Down To: Not on file    Threaten Physical Harm: Not on file    Scream or Curse: Not on file    Review of Systems  HENT:  Positive for sinus pain.        Sinus drainage  All other systems reviewed and are negative.       Objective    BP 120/82   Pulse 65   Temp 97.6 F (36.4 C) (Oral)   Resp 18   Ht 5' 3 (1.6 m)   Wt (!) 304 lb 14.4 oz (138.3 kg)   SpO2 99%   BMI 54.01 kg/m   Physical Exam Vitals and nursing note reviewed.  Constitutional:      Appearance: Normal appearance. She is obese.  HENT:     Head: Normocephalic and atraumatic.     Right Ear: External ear normal.     Left Ear: External ear normal.     Nose: Nose normal.     Mouth/Throat:     Mouth: Mucous membranes are moist.     Pharynx: Oropharynx is clear.  Eyes:     Conjunctiva/sclera: Conjunctivae normal.     Pupils: Pupils are equal, round, and reactive to light.  Cardiovascular:     Rate and Rhythm: Normal rate and regular  rhythm.     Pulses: Normal pulses.     Heart sounds: Normal heart sounds.  Pulmonary:     Effort: Pulmonary effort  is normal.     Breath sounds: Normal breath sounds.  Skin:    General: Skin is warm.     Capillary Refill: Capillary refill takes less than 2 seconds.  Neurological:     General: No focal deficit present.     Mental Status: She is alert and oriented to person, place, and time. Mental status is at baseline.  Psychiatric:        Mood and Affect: Mood normal.        Behavior: Behavior normal.        Thought Content: Thought content normal.        Judgment: Judgment normal.       Assessment & Plan:   Problem List Items Addressed This Visit   None  Encounter to establish care with new doctor  Acquired hypothyroidism -     Levothyroxine  Sodium; Take 1 tablet (100 mcg total) by mouth daily before breakfast.  Dispense: 90 tablet; Refill: 0 -     TSH + free T4  Allergic rhinitis, unspecified seasonality, unspecified trigger -     Montelukast  Sodium; Take 1 tablet (10 mg total) by mouth at bedtime.  Dispense: 30 tablet; Refill: 3  Depression with anxiety  Impaired glucose tolerance -     Hemoglobin A1c  Pt with hypothyroidism. Refilled Levothyroxine  100mcg before pt realized she still had a few weeks left. Advised to hold from getting filled for now but will await thyroid results. Due to uncontrolled allergic rhinitis despite zyrtec, change to Singulair  10mg  daily.  Continue follow up with psychiatry for depression/anxiety. Due to hx of prediabetes, recheck A1c.    No follow-ups on file.   Torrence CINDERELLA Barrier, MD
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
## 2024-03-18 LAB — HEMOGLOBIN A1C
Est. average glucose Bld gHb Est-mCnc: 97 mg/dL
Hgb A1c MFr Bld: 5 % (ref 4.8–5.6)

## 2024-03-18 LAB — TSH+FREE T4
Free T4: 1 ng/dL (ref 0.82–1.77)
TSH: 3.32 u[IU]/mL (ref 0.450–4.500)

## 2024-03-20 ENCOUNTER — Ambulatory Visit: Payer: Self-pay | Admitting: Family Medicine

## 2024-03-22 ENCOUNTER — Encounter (HOSPITAL_COMMUNITY): Payer: Self-pay

## 2024-03-22 ENCOUNTER — Ambulatory Visit (INDEPENDENT_AMBULATORY_CARE_PROVIDER_SITE_OTHER): Admitting: Licensed Clinical Social Worker

## 2024-03-22 DIAGNOSIS — F411 Generalized anxiety disorder: Secondary | ICD-10-CM

## 2024-03-22 DIAGNOSIS — F338 Other recurrent depressive disorders: Secondary | ICD-10-CM

## 2024-03-22 NOTE — Progress Notes (Unsigned)
 THERAPIST PROGRESS NOTE  Session Time:3:02 pm-3:45 pm  Type of Therapy: {CHL AMB BH Type of Therapy:21022741}  Session#1   Treatment Goals: ***  Session Goals:  Behavior: Patient was oriented x4 (person, place, situation, and time) . Patient was  {BHH AFFECT:22266}. Patient was {Appearance:22683}. Patient made *** progress on *** goals at this time.   Interventions: Therapist utilized CBT and  to address ***. Therapist provided support and empathy to patient during session.    Response:    Patient engaged in session. Patient responded well to interventions. Patient continues to meet criteria for Generalized anxiety disorder  Other recurrent depressive disorders [facility])  Patient will continue in outpatient therapy due to being the least restrictive service to meet her needs.   Plan: ***  Suicidal/Homicidal:  {BHH YES OR NO:22294} {yes/no/with/without intent/plan:22693}   {CHL IP BHH SUICIDE RISK ATTEMPT AS MANIFESTED AB:6954983}  Protective Factors: {Protective Factors for Suicide Mpdx:69585986}   Collaboration of Care: {BH OP Collaboration of Care:21014065}  Patient/Guardian was advised Release of Information must be obtained prior to any record release in order to collaborate their care with an outside provider. Patient/Guardian was advised if they have not already done so to contact the registration department to sign all necessary forms in order for us  to release information regarding their care.   Consent: Patient/Guardian gives verbal consent for treatment and assignment of benefits for services provided during this visit. Patient/Guardian expressed understanding and agreed to proceed.

## 2024-03-29 ENCOUNTER — Ambulatory Visit (INDEPENDENT_AMBULATORY_CARE_PROVIDER_SITE_OTHER): Admitting: Licensed Clinical Social Worker

## 2024-03-29 DIAGNOSIS — F338 Other recurrent depressive disorders: Secondary | ICD-10-CM

## 2024-03-29 DIAGNOSIS — F411 Generalized anxiety disorder: Secondary | ICD-10-CM

## 2024-03-29 NOTE — Progress Notes (Signed)
 THERAPIST PROGRESS NOTE  Session Time: 9:02 am-9:45 am  Type of Therapy: Individual Therapy  Session#2   Treatment Goals: Elsye will manage anxiety and mood as evidenced by increasing coping skills, identify and challenge anxious thoughts, not feel like a burden to her family, and adjusting to daily life again for 5 out of 7 days for 60 days.   Session Goals:  Adjusting to life, Anxiety  Behavior: Patient is having moments with her nephew that are positive even if they don't interact directly. She has had moments of laughter with her family. Patient's mood has been more stable due to her it is what it is self talk but her anxiety continues to be high.Patient was oriented x4 (person, place, situation, and time) . Patient was  Anxious. Patient was Casual. Patient made minimal progress on her goals at this time.   Interventions: Therapist used CBT interventions to identify anxious thoughts. Therapist used cognitive restructuring to assist patient in challenging her anxious thoughts.    Response:  Patient verbalized her anxious thoughts including: worried she is doing what she needs to do for parole, worries about being a burden to her family so she goes above and beyond, and the upcoming holidays may be overwhelming. She was able to identify automatic negative thoughts such as something bad is going to happen, I will not be able to cope, What if I don't do it right, and What will people think of me? She experiences racing heart, tension, dizziness at times, and gets sweaty. She may talk faster when this happens. She has felt anxious, scared, nervous, irritable at times, and depression. Patient feels like she mess up her family's routine when she came back home after being released. Patient is willing to remind herself that she was and is a part of the family as well as the routine. She is contributing to the home.   Patient engaged in session. Patient responded well to interventions.  Patient continues to meet criteria for Generalized anxiety disorder  Other recurrent depressive disorders [facility])  Patient will continue in outpatient therapy due to being the least restrictive service to meet her needs.   Plan: Patient will return in 1-2 weeks. Patient will use self talk to challenge anxious thoughts and catch her anxiety symptoms early.   Suicidal/Homicidal:  No without intent/plan   Feelings of depression and/or anhedonia  Protective Factors: positive social support, responsibility to others (children, family), and hope for the future  Collaboration of Care: Other patient got a new psychiatrist.  Patient/Guardian was advised Release of Information must be obtained prior to any record release in order to collaborate their care with an outside provider. Patient/Guardian was advised if they have not already done so to contact the registration department to sign all necessary forms in order for us  to release information regarding their care.   Consent: Patient/Guardian gives verbal consent for treatment and assignment of benefits for services provided during this visit. Patient/Guardian expressed understanding and agreed to proceed.

## 2024-04-04 ENCOUNTER — Ambulatory Visit (HOSPITAL_COMMUNITY): Admitting: Licensed Clinical Social Worker

## 2024-04-04 DIAGNOSIS — F338 Other recurrent depressive disorders: Secondary | ICD-10-CM | POA: Diagnosis not present

## 2024-04-04 DIAGNOSIS — F411 Generalized anxiety disorder: Secondary | ICD-10-CM

## 2024-04-04 NOTE — Progress Notes (Unsigned)
 THERAPIST PROGRESS NOTE  Session Time: 1:03 pm-1:45 pm   Type of Therapy: Individual Therapy  Session#3    Treatment Goals: Kharis will manage anxiety and mood as evidenced by increasing coping skills, identify and challenge anxious thoughts, not feel like a burden to her family, and adjusting to daily life again for 5 out of 7 days for 60 days.   Session Goals: Anxiety, values  Behavior: Patient has been applying for jobs and went in to [employer] for an interview despite her anxiety. Patient was oriented x4 (person, place, situation, and time) . Patient was  Anxious. Patient was Casual. Patient made minimal progress on her goals at this time.   Interventions: Therapist used CBT interventions of cognitive restructuring to challenge anxious thinking and identifying her values which guide her decisions.    Response: Patient has been able to challenge her thought I'm a burden to my family by recognizing that her family trust her and appreciate her. Her family was sick and she was able to take her niece and nephew to school and/or sport events. She was back in before her curfew. She was able to identify her personal values including: family, jobs, independence, and her dog. She was not ready to put herself down as someone she values. Patient was able to see how her values influenced her going into [employer] due to family, wanting a job, and independence. Patient also recognized that she values her freedom.   Patient engaged in session. Patient responded well to interventions. Patient continues to meet criteria for Generalized anxiety disorder  Other recurrent depressive disorders [facility])  Patient will continue in outpatient therapy due to being the least restrictive service to meet her needs.   Plan: Patient will return in 1-2 weeks. Patient will remind herself of her values when anxious thoughts arise and she wants to avoid.   Suicidal/Homicidal:  No without intent/plan   Feelings of  depression and/or anhedonia  Protective Factors: positive social support, responsibility to others (children, family), and hope for the future  Collaboration of Care: Other patient got a new psychiatrist.  Patient/Guardian was advised Release of Information must be obtained prior to any record release in order to collaborate their care with an outside provider. Patient/Guardian was advised if they have not already done so to contact the registration department to sign all necessary forms in order for us  to release information regarding their care.   Consent: Patient/Guardian gives verbal consent for treatment and assignment of benefits for services provided during this visit. Patient/Guardian expressed understanding and agreed to proceed.

## 2024-04-06 ENCOUNTER — Ambulatory Visit (HOSPITAL_COMMUNITY): Admitting: Licensed Clinical Social Worker

## 2024-04-11 DIAGNOSIS — M179 Osteoarthritis of knee, unspecified: Secondary | ICD-10-CM | POA: Insufficient documentation

## 2024-04-13 ENCOUNTER — Ambulatory Visit (HOSPITAL_COMMUNITY): Admitting: Licensed Clinical Social Worker

## 2024-04-20 ENCOUNTER — Ambulatory Visit (HOSPITAL_COMMUNITY): Admitting: Licensed Clinical Social Worker

## 2024-04-24 ENCOUNTER — Encounter: Payer: Self-pay | Admitting: Urgent Care

## 2024-04-24 ENCOUNTER — Ambulatory Visit: Payer: Self-pay | Admitting: Urgent Care

## 2024-04-24 ENCOUNTER — Ambulatory Visit: Payer: Self-pay

## 2024-04-24 ENCOUNTER — Ambulatory Visit: Admitting: Urgent Care

## 2024-04-24 ENCOUNTER — Ambulatory Visit

## 2024-04-24 VITALS — BP 146/89 | HR 63 | Temp 98.5°F | Ht 63.0 in | Wt 315.0 lb

## 2024-04-24 DIAGNOSIS — U071 COVID-19: Secondary | ICD-10-CM

## 2024-04-24 DIAGNOSIS — R519 Headache, unspecified: Secondary | ICD-10-CM

## 2024-04-24 DIAGNOSIS — R0689 Other abnormalities of breathing: Secondary | ICD-10-CM | POA: Diagnosis not present

## 2024-04-24 DIAGNOSIS — R051 Acute cough: Secondary | ICD-10-CM | POA: Diagnosis not present

## 2024-04-24 DIAGNOSIS — Z2089 Contact with and (suspected) exposure to other communicable diseases: Secondary | ICD-10-CM

## 2024-04-24 LAB — POC COVID19/FLU A&B COMBO
Covid Antigen, POC: POSITIVE — AB
Influenza A Antigen, POC: NEGATIVE
Influenza B Antigen, POC: NEGATIVE

## 2024-04-24 MED ORDER — NIRMATRELVIR/RITONAVIR (PAXLOVID)TABLET: 3.0000 | ORAL_TABLET | Freq: Two times a day (BID) | ORAL | 0 refills | Status: AC

## 2024-04-24 NOTE — Progress Notes (Unsigned)
 Established Patient Office Visit  Subjective:  Patient ID: Monique Duncan, female    DOB: 01/04/1986  Age: 38 y.o. MRN: [id number]  Chief Complaint  Patient presents with   Cough    Cough  Discussed the use of AI scribe software for clinical note transcription with the patient, who gave verbal consent to proceed.  History of Present Illness   Monique Duncan is a 38 year old female who presents with cough, body aches, and sinus congestion.  She has been experiencing a persistent cough, body aches, nasal congestion, and a sore throat. Her nose feels obstructed, and she has a throbbing headache with sinus pressure. The cough is severe enough to induce vomiting, and she occasionally experiences chest tightness and wheezing.  She feels off balance, especially when walking, and her mother has observed her nearly falling. While she does not feel dizzy when sitting, her equilibrium feels off. Her eyes are watery, similar to when one feels sick.  The symptoms began three days ago, with today being the worst. She has been taking Benadryl  and a decongestant (referred to as CTMs) for her symptoms, but these have not provided relief.  She has been exposed to family members who have been ill recently, including her mother who had pneumonia, her sister with bronchitis, and her niece who is also sick.  No fever but confirms sinus drainage and the ability to taste and smell.       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   Tear of medial meniscus of knee 08/29/2020   Chronic headaches 10/01/2019   Hypothyroidism 10/01/2019   Moderate episode of recurrent major depressive disorder (HCC) 10/01/2019   Morbid obesity with BMI of 60.0-69.9, adult (HCC) 10/01/2019    Neuropathic pain 10/01/2019   Pain in left knee 06/30/2019   Laceration of thumb 11/09/2017   New daily persistent headache 03/11/2015   Migraine without aura and without status migrainosus, not intractable 03/11/2015   White matter abnormality on MRI of brain 03/11/2015   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   Cellulitis and abscess of trunk 07/17/2014   Abdominal pain 04/10/2014   Biliary dyskinesia 04/10/2014   Uterine fibroid 04/10/2014   Morbid obesity (HCC) 04/10/2014   Past Medical History:  Diagnosis Date   Anxiety    Cellulitis    Depression    Headache    pt denies    Hypothyroidism    Sleep apnea    mild no cpap    Past Surgical History:  Procedure Laterality Date   CHOLECYSTECTOMY N/A 04/11/2014   Procedure: LAPAROSCOPIC CHOLECYSTECTOMY;  Surgeon: Lynda Leos, MD;  Location: [facility];  Service: General;  Laterality: N/A;   KNEE ARTHROSCOPY Left 10/29/2020   Procedure: ARTHROSCOPY KNEE;  Surgeon: Kay Kemps, MD;  Location: [facility];  Service: Orthopedics;  Laterality: Left;   MYRINGOTOMY WITH TUBE PLACEMENT     naval removal     TONSILLECTOMY     UTERINE FIBROID SURGERY     Social History   Tobacco Use   Smoking status: Never    Passive exposure: Current   Smokeless tobacco: Never  Vaping Use   Vaping status: Never Used  Substance Use Topics   Alcohol use: No    Alcohol/week: 0.0 standard drinks of alcohol   Drug use: No  ROS: as noted in HPI  Objective:     BP (!) 146/89   Pulse 63   Temp 98.5 F (36.9 C)   Ht 5' 3 (1.6 m)   Wt (!) 315 lb (142.9 kg)   SpO2 100%   BMI 55.80 kg/m  BP Readings from Last 3 Encounters:  04/24/24 (!) 146/89  03/17/24 120/82  10/29/20 (!) 138/92   Wt Readings from Last 3 Encounters:  04/24/24 (!) 315 lb (142.9 kg)  03/17/24 (!) 304 lb 14.4 oz (138.3 kg)  10/29/20 (!) 371 lb 6.4 oz (168.5 kg)      Physical Exam Vitals and nursing note reviewed.  Constitutional:      General: She is not in acute  distress.    Appearance: Normal appearance. She is ill-appearing (acutely ill). She is not toxic-appearing or diaphoretic.  HENT:     Head: Normocephalic and atraumatic.     Salivary Glands: Right salivary gland is not diffusely enlarged or tender. Left salivary gland is not diffusely enlarged or tender.     Right Ear: No swelling or tenderness. No middle ear effusion. Tympanic membrane is scarred. Tympanic membrane is not injected, perforated or erythematous.     Left Ear: No swelling or tenderness.  No middle ear effusion. Tympanic membrane is scarred. Tympanic membrane is not injected, perforated or erythematous.     Nose: Nose normal. No congestion or rhinorrhea.     Right Turbinates: Not enlarged or swollen.     Left Turbinates: Not enlarged or swollen.     Right Sinus: No maxillary sinus tenderness or frontal sinus tenderness.     Left Sinus: No maxillary sinus tenderness or frontal sinus tenderness.     Mouth/Throat:     Lips: Pink.     Mouth: Mucous membranes are moist.     Pharynx: Oropharynx is clear. Uvula midline. No oropharyngeal exudate, posterior oropharyngeal erythema or uvula swelling.  Cardiovascular:     Rate and Rhythm: Normal rate and regular rhythm.  Pulmonary:     Effort: Pulmonary effort is normal. No respiratory distress.     Breath sounds: Rhonchi (coarse rhonchi bibasilar) present. No wheezing.  Musculoskeletal:     Cervical back: Normal range of motion and neck supple. No rigidity or tenderness.  Lymphadenopathy:     Cervical: No cervical adenopathy.  Skin:    General: Skin is warm and dry.     Coloration: Skin is not jaundiced.     Findings: No bruising or erythema.  Neurological:     General: No focal deficit present.     Mental Status: She is alert and oriented to person, place, and time.      Results for orders placed or performed in visit on 04/24/24  POC Covid19/Flu A&B Antigen  Result Value Ref Range   Influenza A Antigen, POC Negative  Negative   Influenza B Antigen, POC Negative Negative   Covid Antigen, POC Positive (A) Negative      The ASCVD Risk score (Arnett DK, et al., 2019) failed to calculate for the following reasons:   The 2019 ASCVD risk score is only valid for ages 7 to 35  Assessment & Plan:  Adventitious breath sounds -     DG Chest 2 View; Future  Exposure to pneumonia -     DG Chest 2 View; Future  Acute cough -     POC Covid19/Flu A&B Antigen  Acute intractable headache, unspecified headache type  COVID-19 -     nirmatrelvir/ritonavir;  Take 3 tablets by mouth 2 (two) times daily for 5 days. (Take nirmatrelvir 150 mg two tablets twice daily for 5 days and ritonavir 100 mg one tablet twice daily for 5 days) Patient GFR is 115  Dispense: 30 tablet; Refill: 0  Assessment and Plan    Acute upper respiratory infection with cough, congestion, and sore throat - POC flu and covid test, positive for covid. Pt on day 3 of sx - start paxlovid x 5 day  Headache Throbbing headache likely related to sinus congestion from the upper respiratory infection.  Suspected pneumonia (under evaluation) Suspicion of pneumonia due to coarse breath sounds and recent exposure to family members with pneumonia. - Order chest x-ray to evaluate for pneumonia.         No follow-ups on file.   Benton LITTIE Gave, PA

## 2024-04-24 NOTE — Patient Instructions (Signed)
 You are positive for covid.  Please read the attached information regarding covid and paxlovid. Take paxlovid 3 pills twice daily for 5 days. Common side effects include diarrhea or nausea and mild headache.  Please rest and drink plenty of water. You can purchased over the counter oscillococcinum - this helps with body aches. Over the counter Quercetin with zinc  helps boost your natural immune system to fight off the virus.   Please go to [address] downstairs to ensure you do not have pneumonia - I would like to obtain a chest xray as well.

## 2024-04-24 NOTE — Telephone Encounter (Signed)
 FYI Only or Action Required?: Action required by provider: request for appointment.  Patient was last seen in primary care on 03/17/2024 by Colette Torrence GRADE, MD.  Called Nurse Triage reporting Cough.  Symptoms began yesterday.  Interventions attempted: Nothing.  Symptoms are: gradually worsening.  Triage Disposition: See Physician Within 24 Hours  Patient/caregiver understands and will follow disposition?:     Copied from CRM #[id number]. Topic: Clinical - Red Word Triage >> Apr 24, 2024  8:16 AM Ahlexyia S wrote: [facility] that prompted transfer to Nurse Triage: Pt called in stating that she hasn't been feeling too well for the past two days. Pt stated she is throwing up, has a congested nose, dizziness and shaky. Pt also mentioned that she can't keep anything down. Warm transferred to nurse triage. Reason for Disposition  SEVERE coughing spells (e.g., whooping sound after coughing, vomiting after coughing)  Answer Assessment - Initial Assessment Questions 1. ONSET: When did the cough begin?      2 days 2. SEVERITY: How bad is the cough today?      severe 3. SPUTUM: Describe the color of your sputum (e.g., none, dry cough; clear, white, yellow, green)     Dark green 4. HEMOPTYSIS: Are you coughing up any blood? If Yes, ask: How much? (e.g., flecks, streaks, tablespoons, etc.)     no 5. DIFFICULTY BREATHING: Are you having difficulty breathing? If Yes, ask: How bad is it? (e.g., mild, moderate, severe)      no 6. FEVER: Do you have a fever? If Yes, ask: What is your temperature, how was it measured, and when did it start?     Low grade 7. CARDIAC HISTORY: Do you have any history of heart disease? (e.g., heart attack, congestive heart failure)      no 8. LUNG HISTORY: Do you have any history of lung disease?  (e.g., pulmonary embolus, asthma, emphysema)     no 9. PE RISK FACTORS: Do you have a history of blood clots? (or: recent major surgery, recent  prolonged travel, bedridden)     no 10. OTHER SYMPTOMS: Do you have any other symptoms? (e.g., runny nose, wheezing, chest pain)       Body aches, vomiting 11. PREGNANCY: Is there any chance you are pregnant? When was your last menstrual period?       no 12. TRAVEL: Have you traveled out of the country in the last month? (e.g., travel history, exposures)       no  Protocols used: Cough - Acute Productive-A-AH

## 2024-04-26 ENCOUNTER — Ambulatory Visit: Admitting: Family Medicine

## 2024-04-27 ENCOUNTER — Ambulatory Visit (HOSPITAL_COMMUNITY): Admitting: Licensed Clinical Social Worker

## 2024-05-21 ENCOUNTER — Encounter: Payer: Self-pay | Admitting: Family Medicine

## 2024-05-22 ENCOUNTER — Ambulatory Visit: Payer: Self-pay

## 2024-05-22 NOTE — Telephone Encounter (Signed)
 FYI Only or Action Required?: FYI only for provider.  Patient was last seen in primary care on 04/24/2024 by Lowella Benton CROME, PA.  Called Nurse Triage reporting Cough, Headache, and Otalgia.  Symptoms began cough x 2 months, earache x few days.  Interventions attempted: OTC medications: Tussin cough meds and Prescription medications: Singulair .  Symptoms are: dry cough constant/persistent, headache, right ear ache gradually worsening.  Triage Disposition: See Physician Within 24 Hours  Patient/caregiver understands and will follow disposition?: Yes             Copied from CRM #[id number]. Topic: Clinical - Red Word Triage >> May 22, 2024 10:44 AM Joesph NOVAK wrote: Red Word that prompted transfer to Nurse Triage: Patient says she has a persistent cough, headache. Reason for Disposition  [1] Continuous (nonstop) coughing interferes with work or school AND [2] no improvement using cough treatment per Care Advice  Answer Assessment - Initial Assessment Questions 1. ONSET: When did the cough begin?      2 months ago.  2. SEVERITY: How bad is the cough today?      Constant, persistent. Causes headache from all the pressure.  3. SPUTUM: Describe the color of your sputum (e.g., none, dry cough; clear, white, yellow, green)     Dry.  4. HEMOPTYSIS: Are you coughing up any blood? If so ask: How much? (e.g., flecks, streaks, tablespoons, etc.)     No.  5. DIFFICULTY BREATHING: Are you having difficulty breathing? If Yes, ask: How bad is it? (e.g., mild, moderate, severe)      She states she can't tell if she is having any difficulty breathing. Patient able to speak in full sentences, no labored breathing noted. Coughing noted during triage.  6. FEVER: Do you have a fever? If Yes, ask: What is your temperature, how was it measured, and when did it start?     No.  7. CARDIAC HISTORY: Do you have any history of heart disease? (e.g., heart attack, congestive heart  failure)      No.  8. LUNG HISTORY: Do you have any history of lung disease?  (e.g., pulmonary embolus, asthma, emphysema)     No.  9. PE RISK FACTORS: Do you have a history of blood clots? (or: recent major surgery, recent prolonged travel, bedridden)     No.  10. OTHER SYMPTOMS: Do you have any other symptoms? (e.g., runny nose, wheezing, chest pain)       Earache (right side, x few days), headache. Denies swelling, chest pain, sinus pain/congestion, sore throat.  11. PREGNANCY: Is there any chance you are pregnant? When was your last menstrual period?       LMP: currently on, started yesterday.  12. TRAVEL: Have you traveled out of the country in the last month? (e.g., travel history, exposures)       No.  Protocols used: Cough - Chronic-A-AH

## 2024-05-23 ENCOUNTER — Encounter: Payer: Self-pay | Admitting: Family Medicine

## 2024-05-23 ENCOUNTER — Ambulatory Visit: Admitting: Family Medicine

## 2024-05-23 VITALS — BP 128/78 | HR 62 | Ht 63.0 in | Wt 331.0 lb

## 2024-05-23 DIAGNOSIS — R059 Cough, unspecified: Secondary | ICD-10-CM | POA: Insufficient documentation

## 2024-05-23 DIAGNOSIS — R052 Subacute cough: Secondary | ICD-10-CM

## 2024-05-23 MED ORDER — PANTOPRAZOLE SODIUM 40 MG PO TBEC: 40.0000 mg | DELAYED_RELEASE_TABLET | Freq: Every day | ORAL | 0 refills | Status: DC

## 2024-05-23 MED ORDER — BENZONATATE 200 MG PO CAPS: 200.0000 mg | ORAL_CAPSULE | Freq: Two times a day (BID) | ORAL | 0 refills | Status: DC | PRN

## 2024-05-23 NOTE — Progress Notes (Signed)
 Monique Duncan - 38 y.o. female MRN [id number]  Date of birth: [birth year]  Subjective Chief Complaint  Patient presents with   Cough    HPI Monique Duncan is a 38 y.o. female here today with complaint of cough.  She has had cough for several months.  She had COVID at the end of September but reports that cough started prior to this.  It has worsened since COVID.  Cough is dry.  She denies fever or chills.  CXR normal in September.  PCP did try her on montelukast  which has not really helped.  She has not had wheezing or dyspnea.  She has not really tried anything over-the-counter so far.  She report having some increased reflux symptoms as well.  Denies abdominal pain.  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    Past Medical History:  Diagnosis Date   Anxiety    Cellulitis    Depression    Headache    pt denies    Hypothyroidism    Sleep apnea    mild no cpap     Past Surgical History:  Procedure Laterality Date   CHOLECYSTECTOMY N/A 04/11/2014   Procedure: LAPAROSCOPIC CHOLECYSTECTOMY;  Surgeon: Lynda Leos, MD;  Location: [facility];  Service: General;  Laterality: N/A;   KNEE ARTHROSCOPY Left 10/29/2020   Procedure: ARTHROSCOPY KNEE;  Surgeon: Kay Kemps, MD;  Location: [facility];  Service: Orthopedics;  Laterality: Left;   MYRINGOTOMY WITH TUBE PLACEMENT     naval removal     TONSILLECTOMY     UTERINE FIBROID SURGERY      Social History   Socioeconomic History   Marital status: Single    Spouse name: Not on file   Number of children: Not on file   Years of education: Not on file   Highest education level: Not on file  Occupational History   Not on file  Tobacco Use   Smoking status: Never    Passive exposure: Current    Smokeless tobacco: Never  Vaping Use   Vaping status: Never Used  Substance and Sexual Activity   Alcohol use: No    Alcohol/week: 0.0 standard drinks of alcohol   Drug use: No   Sexual activity: Not Currently    Partners: Male  Other Topics Concern   Not on file  Social History Narrative   Not on file   Social Drivers of Health   Financial Resource Strain: Patient Declined (04/21/2024)   Received from [facility]   Overall Financial Resource Strain (CARDIA)    How hard is it for you to pay for the very basics like food, housing, medical care, and heating?: Patient declined  Food Insecurity: No Food Insecurity (04/21/2024)   Received from [facility]   Hunger Vital Sign    Within the past 12 months, you worried that your food would run out before you got the money to buy more.: Never true    Within the past 12 months, the food you bought just didn't last and you didn't have money to get more.: Never true  Transportation Needs: Patient Declined (04/21/2024)   Received from [facility] - Transportation    In the past 12 months, has lack  of transportation kept you from medical appointments or from getting medications?: Patient declined    In the past 12 months, has lack of transportation kept you from meetings, work, or from getting things needed for daily living?: Patient declined  Physical Activity: Not on file  Stress: Not on file  Social Connections: Unknown (12/07/2021)   Received from [facility]   Social Network    Social Network: Not on file    Family History  Problem Relation Age of Onset   Depression Mother    Migraines Mother    Breast cancer Maternal Grandmother    Diabetes Maternal Grandfather    Leukemia Paternal Grandfather     Health Maintenance  Topic Date Due   FOOT EXAM  Never done   OPHTHALMOLOGY EXAM  Never done   HIV Screening  Never done   Diabetic kidney evaluation - Urine ACR  Never done   Hepatitis C Screening  Never done    Pneumococcal Vaccine (1 of 2 - PCV) Never done   HPV VACCINES (1 - 3-dose SCDM series) Never done   Cervical Cancer Screening (HPV/Pap Cotest)  01/05/2017   Diabetic kidney evaluation - eGFR measurement  12/07/2017   Influenza Vaccine  02/25/2024   COVID-19 Vaccine (1 - 2025-26 season) Never done   HEMOGLOBIN A1C  09/17/2024   DTaP/Tdap/Td (7 - Td or Tdap) 10/13/2027   Hepatitis B Vaccines 19-59 Average Risk  Completed   Meningococcal B Vaccine  Aged Out     ----------------------------------------------------------------------------------------------------------------------------------------------------------------------------------------------------------------- Physical Exam BP 128/78   Pulse 62   Ht 5' 3 (1.6 m)   Wt (!) 331 lb (150.1 kg)   LMP 04/18/2024   SpO2 97%   BMI 58.63 kg/m   Physical Exam Constitutional:      Appearance: Normal appearance.  HENT:     Head: Normocephalic and atraumatic.  Eyes:     General: No scleral icterus. Cardiovascular:     Rate and Rhythm: Normal rate and regular rhythm.  Pulmonary:     Effort: Pulmonary effort is normal.     Breath sounds: Normal breath sounds.  Musculoskeletal:     Cervical back: Neck supple.  Neurological:     Mental Status: She is alert.  Psychiatric:        Mood and Affect: Mood normal.        Behavior: Behavior normal.     ------------------------------------------------------------------------------------------------------------------------------------------------------------------------------------------------------------------- Assessment and Plan  Cough Postviral cough versus GERD related.  Cough does seem to predate her COVID infection last month.  Providing trial of pantoprazole  daily.  Tessalon Perles as needed.  Follow-up in about 4 weeks for cough.   Meds ordered this encounter  Medications   pantoprazole  (PROTONIX ) 40 MG tablet    Sig: Take 1 tablet (40 mg total) by mouth daily.    Dispense:   30 tablet    Refill:  0   benzonatate (TESSALON) 200 MG capsule    Sig: Take 1 capsule (200 mg total) by mouth 2 (two) times daily as needed for cough.    Dispense:  20 capsule    Refill:  0    Return in about 4 weeks (around 06/20/2024) for Cough.

## 2024-05-23 NOTE — Patient Instructions (Signed)
 Try pantoprazole  daily.  Stay well hydrated.  Try tessalon perles as well.

## 2024-05-23 NOTE — Assessment & Plan Note (Addendum)
 Postviral cough versus GERD related.  Cough does seem to predate her COVID infection last month.  Providing trial of pantoprazole  daily.  Tessalon Perles as needed.  Follow-up in about 4 weeks for cough.

## 2024-05-24 ENCOUNTER — Ambulatory Visit (HOSPITAL_COMMUNITY): Admitting: Licensed Clinical Social Worker

## 2024-05-31 ENCOUNTER — Ambulatory Visit (HOSPITAL_COMMUNITY): Admitting: Licensed Clinical Social Worker

## 2024-05-31 DIAGNOSIS — F411 Generalized anxiety disorder: Secondary | ICD-10-CM | POA: Diagnosis not present

## 2024-05-31 DIAGNOSIS — F338 Other recurrent depressive disorders: Secondary | ICD-10-CM

## 2024-05-31 NOTE — Progress Notes (Unsigned)
 THERAPIST PROGRESS NOTE  Session Time: 11:00 am-11:45 am  Type of Therapy: Individual Therapy  Session#4   Treatment Goals: Monique Duncan will manage anxiety and mood as evidenced by increasing coping skills, identify and challenge anxious thoughts, not feel like a burden to her family, and adjusting to daily life again for 5 out of 7 days for 60 days.   Session Goals: Anxiety, values  Behavior: Patient noted that she lost her grandmother since last session. She was able to talk to her PO and got permission. She was able to get a job at [employer] and has been working hard.  Patient was oriented x4 (person, place, situation, and time) . Patient was  Anxious. Patient was Casual. Patient made minimal progress on her goals at this time.   Interventions: Therapist used CBT interventions of cognitive restructuring to improve healthy cognition.    Response: Patient noted that she is feeling less like a burden at home. She is paying $100 a week in rent and $75 a week to her sister for groceries. 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 Patient is starting to feel more comfortable adjusting to outside life. She is feeling frazzled with all she has to do. Patient is paying off her debt to the court. She is doing all she can to do what is right. Patient is working around males. She has been anxious around males since being incarcerated. She has exposed herself to being around males at work and went from being shut off from them to being able to joke with them.  Patient is using self talk and breath work to regulate her thoughts as well as mood. Patient is ready to start decorating for Christmas. This is the first Christmas since she has been out and she wants to get back to normal.   Patient engaged in session. Patient responded well to interventions. Patient continues to meet criteria for Generalized  anxiety disorder  Other recurrent depressive disorders  Patient will continue in outpatient therapy due to being the least restrictive service to meet her needs.   Plan: Patient will return in 1-2 weeks. Patient will continue to use self talk and regulate her breath work.   Suicidal/Homicidal:  No without intent/plan   Feelings of depression and/or anhedonia  Protective Factors: positive social support, responsibility to others (children, family), and hope for the future  Collaboration of Care: Other patient got a new psychiatrist.  Patient/Guardian was advised Release of Information must be obtained prior to any record release in order to collaborate their care with an outside provider. Patient/Guardian was advised if they have not already done so to contact the registration department to sign all necessary forms in order for us  to release information regarding their care.   Consent: Patient/Guardian gives verbal consent for treatment and assignment of benefits for services provided during this visit. Patient/Guardian expressed understanding and agreed to proceed.

## 2024-06-07 ENCOUNTER — Ambulatory Visit: Payer: Self-pay

## 2024-06-07 ENCOUNTER — Ambulatory Visit (INDEPENDENT_AMBULATORY_CARE_PROVIDER_SITE_OTHER): Admitting: Licensed Clinical Social Worker

## 2024-06-07 DIAGNOSIS — F338 Other recurrent depressive disorders: Secondary | ICD-10-CM | POA: Diagnosis not present

## 2024-06-07 DIAGNOSIS — F411 Generalized anxiety disorder: Secondary | ICD-10-CM | POA: Diagnosis not present

## 2024-06-07 NOTE — Progress Notes (Signed)
 THERAPIST PROGRESS NOTE  Session Time: 11:00 am-11:45 am  Type of Therapy: Individual Therapy  Session#5   Treatment Goals: Alaia will manage anxiety and mood as evidenced by increasing coping skills, identify and challenge anxious thoughts, not feel like a burden to her family, and adjusting to daily life again for 5 out of 7 days for 60 days.   Session Goals: Anxiety, Exceptions  Behavior: Patient noted that she has been working a lot and getting along with family. Patient was oriented x4 (person, place, situation, and time) . Patient was  Anxious. Patient was Casual. Patient made minimal progress on her goals at this time.   Interventions: Therapist used SFBT interventions to identify exceptions to anxiety.    Response: Patient has been working. She is experiencing stress related to work but not anxiety. 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 She was a little anxious doing it but did it anyway. Patient has been getting along with her co-workers including her female co-workers. She feels like her PO is available to her and she is able to talk to her about how things are. 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 When she walks through the door of her home she automatically starts to empty her pockets. Patient is trying to reframe this as preparing to relax after work rather than preparing to be searched.   Patient engaged in session. Patient responded well to interventions. Patient continues to meet criteria for Generalized anxiety disorder  Other recurrent depressive disorders  Patient will continue in outpatient therapy due to being the least restrictive service to meet her needs.   Plan: Patient will return in 1-2 weeks. Patient will continue to regulate her anxiety and expose herself to anxiety inducing situations to increase distress tolerance.    Suicidal/Homicidal:  No without intent/plan   Feelings of depression and/or anhedonia  Protective Factors: positive social support, responsibility to others (children, family), and hope for the future  Collaboration of Care: Other patient got a new psychiatrist.  Patient/Guardian was advised Release of Information must be obtained prior to any record release in order to collaborate their care with an outside provider. Patient/Guardian was advised if they have not already done so to contact the registration department to sign all necessary forms in order for us  to release information regarding their care.   Consent: Patient/Guardian gives verbal consent for treatment and assignment of benefits for services provided during this visit. Patient/Guardian expressed understanding and agreed to proceed.

## 2024-06-07 NOTE — Telephone Encounter (Signed)
 FYI Only or Action Required?: FYI only for provider: appointment scheduled on 11/13.  Patient was last seen in primary care on 05/23/2024 by Alvia Bring, DO.  Called Nurse Triage reporting Shoulder Pain.  Symptoms began about a month ago.  Interventions attempted: Rest, hydration, or home remedies and Ice/heat application.  Symptoms are: unchanged.  Triage Disposition: See PCP When Office is Open (Within 3 Days)  Patient/caregiver understands and will follow disposition?: Yes     Copied from CRM [id number]. Topic: Clinical - Red Word Triage >> Jun 07, 2024 12:02 PM Jasmin G wrote: Red Word that prompted transfer to Nurse Triage: Pt initially called to make an appt due to experiencing pain on her left shoulder that started a month ago. Pt states that she has a hard time raising it and picking up stuff. >> Jun 07, 2024 12:09 PM Willma R wrote: Patient lost connection while waiting for NT.       Reason for Disposition  [1] MODERATE pain (e.g., interferes with normal activities) AND [2] present > 3 days  Answer Assessment - Initial Assessment Questions 1. ONSET: When did the pain start?     About a month ago  2. LOCATION: Where is the pain located?     Left shoulder  3. PAIN: How bad is the pain? (Scale 1-10; or mild, moderate, severe)     6/10 4. WORK OR EXERCISE: Has there been any recent work or exercise that involved this part of the body?     No 5. CAUSE: What do you think is causing the shoulder pain?     Unsure  6. OTHER SYMPTOMS: Do you have any other symptoms? (e.g., neck pain, swelling, rash, fever, numbness, weakness)     No 7. PREGNANCY: Is there any chance you are pregnant? When was your last menstrual period?     No  Protocols used: Shoulder Pain-A-AH

## 2024-06-08 ENCOUNTER — Encounter: Payer: Self-pay | Admitting: Family Medicine

## 2024-06-08 ENCOUNTER — Ambulatory Visit

## 2024-06-08 ENCOUNTER — Ambulatory Visit (INDEPENDENT_AMBULATORY_CARE_PROVIDER_SITE_OTHER): Payer: Self-pay | Admitting: Family Medicine

## 2024-06-08 VITALS — BP 132/78 | HR 77 | Temp 97.8°F | Ht 63.0 in | Wt 338.0 lb

## 2024-06-08 DIAGNOSIS — M25512 Pain in left shoulder: Secondary | ICD-10-CM

## 2024-06-08 MED ORDER — NAPROXEN 500 MG PO TABS: 500.0000 mg | ORAL_TABLET | Freq: Two times a day (BID) | ORAL | 0 refills | Status: DC

## 2024-06-08 NOTE — Patient Instructions (Signed)
 [facility]  [address]  The radiology department is on the first floor which is best accessed by going around to the back of the building. No appointment necessary. You can go at your convenience.

## 2024-06-08 NOTE — Progress Notes (Signed)
   Acute Office Visit  Subjective:     Patient ID: Monique Duncan, female    DOB: 1986/05/07, 38 y.o.   MRN: [id number]  Chief Complaint  Patient presents with   Shoulder Pain    Left x 1 month    HPI Patient is in today for left shoulder pain for one month.  Thinks she injured the shoulder while putting on bra. No weakness. No radiating pain. Heating pad and cold. Neither helped.  Pain is intermittent. Pain is in the posterior left shoulder. Established with [facility] for knee. Would want to see Dr. Kay, [facility] on [address].  Unable to commit to PT due to unpredictable work schedule.  Works at [employer].  Ibuprofen not working well. Will send naproxen.  Left shoulder x-ray today. Referral sent for Dr. Kay, [facility].   ROS      Objective:    BP 132/78 (BP Location: Left Arm, Patient Position: Sitting, Cuff Size: Large)   Pulse 77   Temp 97.8 F (36.6 C) (Oral)   Ht 5' 3 (1.6 m)   Wt (!) 338 lb (153.3 kg)   LMP 05/19/2024 (Approximate)   SpO2 99%   BMI 59.87 kg/m    Physical Exam Vitals and nursing note reviewed.  Constitutional:      General: She is not in acute distress.    Appearance: Normal appearance.  Cardiovascular:     Rate and Rhythm: Normal rate.  Pulmonary:     Effort: Pulmonary effort is normal.  Musculoskeletal:     Left shoulder: Tenderness present. No swelling or deformity. Decreased range of motion.     Comments: Tenderness in posterior left shoulder to palpation.  Grip strength is 5/5 ROM slightly decreased. Can raise arm over head.   Skin:    General: Skin is warm and dry.  Neurological:     General: No focal deficit present.     Mental Status: She is alert. Mental status is at baseline.  Psychiatric:        Mood and Affect: Mood normal.        Behavior: Behavior normal.        Thought Content: Thought content normal.        Judgment: Judgment normal.     No results found for any visits on 06/08/24.       Assessment & Plan:   Problem List Items Addressed This Visit     Acute pain of left shoulder - Primary   Relevant Medications   naproxen (NAPROSYN) 500 MG tablet   Other Relevant Orders   DG Shoulder Left   Ambulatory referral to Orthopedics    Meds ordered this encounter  Medications   naproxen (NAPROSYN) 500 MG tablet    Sig: Take 1 tablet (500 mg total) by mouth 2 (two) times daily with a meal.    Dispense:  30 tablet    Refill:  0    Supervising Provider:   METHENEY, CATHERINE D [[id number]]  Agrees with plan of care discussed.  Questions answered.   Return if symptoms worsen or fail to improve.  [name], FNP

## 2024-06-09 ENCOUNTER — Ambulatory Visit: Payer: Self-pay | Admitting: Family Medicine

## 2024-06-14 ENCOUNTER — Ambulatory Visit (HOSPITAL_COMMUNITY): Admitting: Licensed Clinical Social Worker

## 2024-06-19 ENCOUNTER — Ambulatory Visit (HOSPITAL_BASED_OUTPATIENT_CLINIC_OR_DEPARTMENT_OTHER)
Admission: RE | Admit: 2024-06-19 | Discharge: 2024-06-19 | Disposition: A | Discharge status: Home / Self Care | Source: Ambulatory Visit | Attending: Family Medicine | Admitting: Family Medicine

## 2024-06-19 ENCOUNTER — Ambulatory Visit: Payer: Self-pay | Admitting: Family Medicine

## 2024-06-19 ENCOUNTER — Encounter: Payer: Self-pay | Admitting: Family Medicine

## 2024-06-19 ENCOUNTER — Ambulatory Visit: Payer: Self-pay

## 2024-06-19 ENCOUNTER — Other Ambulatory Visit: Payer: Self-pay | Admitting: Family Medicine

## 2024-06-19 VITALS — BP 142/90 | HR 79 | Temp 98.7°F | Ht 63.0 in | Wt 339.2 lb

## 2024-06-19 DIAGNOSIS — R52 Pain, unspecified: Secondary | ICD-10-CM

## 2024-06-19 DIAGNOSIS — R051 Acute cough: Secondary | ICD-10-CM | POA: Insufficient documentation

## 2024-06-19 DIAGNOSIS — R5381 Other malaise: Secondary | ICD-10-CM

## 2024-06-19 LAB — POCT INFLUENZA A/B
Influenza A, POC: NEGATIVE
Influenza B, POC: NEGATIVE

## 2024-06-19 LAB — POC COVID19 BINAXNOW: SARS Coronavirus 2 Ag: NEGATIVE

## 2024-06-19 MED ORDER — ALBUTEROL SULFATE HFA 108 (90 BASE) MCG/ACT IN AERS: 2.0000 | INHALATION_SPRAY | Freq: Four times a day (QID) | RESPIRATORY_TRACT | 0 refills | Status: DC | PRN

## 2024-06-19 NOTE — Telephone Encounter (Signed)
 FYI Only or Action Required?: FYI only for provider: appointment scheduled on 06/19/2024.  Patient was last seen in primary care on 06/08/2024 by Booker Darice SAUNDERS, FNP.  Called Nurse Triage reporting Shortness of Breath.  Symptoms began several days ago.  Triage Disposition: See HCP Within 4 Hours (Or PCP Triage)  Patient/caregiver understands and will follow disposition?: Yes           Copied from CRM #[id number]. Topic: Clinical - Red Word Triage >> Jun 19, 2024  9:50 AM Drema MATSU wrote: Red Word that prompted transfer to Nurse Triage: Patient is not not feeling well, have a hard time taking a deep breath, sweating, shaky, and shortness of breath. Reason for Disposition  [1] MILD difficulty breathing (e.g., minimal/no SOB at rest, SOB with walking, pulse < 100) AND [2] NEW-onset or WORSE than normal  Answer Assessment - Initial Assessment Questions This RN scheduled pt for an appointment today at [facility]. Pt aware of location. This RN educated pt on new-worsening symptoms and when to call back/seek emergent care. Pt verbalized understanding and agrees to plan.   SOB only with movement Deep cough that pt states she can feel in chest Symptoms began on Sat  Pt states she can't tell if she has a fever but has sweats and shivering Denies dizziness, chest pain  O2 SATURATION MONITOR:  Do you use an oxygen saturation monitor (pulse oximeter) at home? If Yes, ask: What is your reading (oxygen level) today? What is your usual oxygen saturation reading? (e.g., 95%)       Doesn't have one  Protocols used: Breathing Difficulty-A-AH

## 2024-06-19 NOTE — Progress Notes (Signed)
 [facility] [address], [address] [place], [place] [address] [phone]  Date:  06/19/2024   Name:  Monique Duncan   DOB:  05/06/1986   MRN:  [id number]  PCP:  Colette Torrence GRADE, MD    Chief Complaint: Shortness of Breath (Onset 06/17/2024 night sweats, cough, congestion/I'm not able to take a deep cough, I have a URI but I can't cough the phlegm up its a trouble Harlon note )   History of Present Illness:  Monique Duncan is a 38 y.o. very pleasant female patient who presents with the following:  Pt seen today with concern of SOB History of obesity, DM, hypothyroidism Never a smoker  Discussed the use of AI scribe software for clinical note transcription with the patient, who gave verbal consent to proceed.  History of Present Illness Monique Duncan is a 38 year old female who presents with cough, sweating, and chills.  Symptoms began on Saturday with difficulty taking a deep cough, described as an inability to produce a 'deep, deep cough' despite feeling the need. Significant sweating occurred last night, soaking her shirt and bed. She feels cold and shivering since yesterday and continues to feel cold today. She feels achy and tired, leading her to call out of work this morning. She is unsure if she had a fever. Nausea is present, but no vomiting or diarrhea.  Her right ear has been hurting, and she has a history of ear infections as a child, with tubes placed in her ears. She has been taking ibuprofen for aches and discomfort.  She had COVID-19 two months ago but has not tested for COVID or flu recently. Her kindergarten age daughter frequently catches colds at school, but no one else at home is currently sick.  She works at [employer] in [place] and is concerned about her ability to work due to her symptoms. She has not been able to eat breakfast today due to lack of appetite.   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   Tear of medial meniscus of knee 08/29/2020   Chronic headaches 10/01/2019   Hypothyroidism 10/01/2019   Moderate episode of recurrent major depressive disorder (HCC) 10/01/2019   Morbid obesity with BMI of 60.0-69.9, adult (HCC) 10/01/2019   Neuropathic pain 10/01/2019   Pain in left knee 06/30/2019   Laceration of thumb 11/09/2017   New daily persistent headache 03/11/2015   Migraine without aura and without status migrainosus, not intractable 03/11/2015   White matter abnormality on MRI of brain 03/11/2015   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   Cellulitis and abscess of trunk 07/17/2014   Abdominal pain 04/10/2014   Biliary dyskinesia 04/10/2014   Uterine fibroid 04/10/2014   Morbid obesity (HCC) 04/10/2014    Past Medical History:  Diagnosis Date   Anxiety    Cellulitis    Depression    Headache    pt denies    Hypothyroidism    Sleep apnea    mild no cpap     Past Surgical History:  Procedure Laterality Date   CHOLECYSTECTOMY N/A 04/11/2014   Procedure: LAPAROSCOPIC CHOLECYSTECTOMY;  Surgeon:  Lynda Leos, MD;  Location: [facility];  Service: General;  Laterality: N/A;   KNEE ARTHROSCOPY Left 10/29/2020   Procedure: ARTHROSCOPY KNEE;  Surgeon: Kay Kemps, MD;  Location: [facility];  Service: Orthopedics;  Laterality: Left;   MYRINGOTOMY WITH TUBE PLACEMENT     naval removal     TONSILLECTOMY     UTERINE FIBROID SURGERY      Social History   Tobacco Use   Smoking status: Never    Passive exposure: Current   Smokeless tobacco: Never  Vaping Use   Vaping status: Never Used  Substance Use  Topics   Alcohol use: No    Alcohol/week: 0.0 standard drinks of alcohol   Drug use: No    Family History  Problem Relation Age of Onset   Depression Mother    Migraines Mother    Breast cancer Maternal Grandmother    Diabetes Maternal Grandfather    Leukemia Paternal Grandfather     Allergies  Allergen Reactions   Bee Venom Swelling   Fentanyl  Itching   Vancomycin  Anxiety, Rash and Other (See Comments)    Red Man's syndrome which resulted in patient having a panic reaction as well.    Medication list has been reviewed and updated.  Current Outpatient Medications on File Prior to Visit  Medication Sig Dispense Refill   Acetaminophen  (ACETAMIN PO) Take by mouth.     atenolol (TENORMIN) 25 MG tablet Take 25-50 mg by mouth every morning.     benzonatate  (TESSALON ) 200 MG capsule Take 1 capsule (200 mg total) by mouth 2 (two) times daily as needed for cough. 20 capsule 0   busPIRone (BUSPAR) 15 MG tablet Take 15 mg by mouth 3 (three) times daily.     doxepin (SINEQUAN) 10 MG capsule Take 10 mg by mouth at bedtime.     DULoxetine (CYMBALTA) 30 MG capsule Take 30 mg by mouth daily.     gabapentin (NEURONTIN) 600 MG tablet Take 600 mg by mouth 5 (five) times daily.     hydrOXYzine (VISTARIL) 50 MG capsule Take 50 mg by mouth 3 (three) times daily as needed.     lamoTRIgine (LAMICTAL) 100 MG tablet Take 100 mg by mouth daily.     levothyroxine  (SYNTHROID ) 100 MCG tablet Take 1 tablet (100 mcg total) by mouth daily before breakfast. 90 tablet 0   Lidocaine  HCl (LIDOCAINE  1%/NA BICARB 0.1 MEQ) INJ injection Inject into the skin once.     melatonin 3 MG TABS tablet Take 3 mg by mouth at bedtime.     Menthol-Methyl Salicylate (EQL MUSCLE RUB PAIN RELIEVING) 10-15 % CREA Apply topically.     montelukast  (SINGULAIR ) 10 MG tablet Take 1 tablet (10 mg total) by mouth at bedtime. 30 tablet 3   naproxen  (NAPROSYN ) 500 MG tablet Take 1 tablet (500 mg total) by mouth 2 (two) times daily with a  meal. 30 tablet 0   nortriptyline (PAMELOR) 50 MG capsule Take 50 mg by mouth at bedtime.     pantoprazole  (PROTONIX ) 40 MG tablet TAKE 1 TABLET BY MOUTH EVERY DAY 30 tablet 0   prazosin (MINIPRESS) 2 MG capsule Take 2-4 mg by mouth at bedtime.     SUMAtriptan (IMITREX) 100 MG tablet Take 100 mg by mouth every 2 (two) hours as needed for migraine. May repeat in 2 hours if headache persists or recurs.     traMADol (ULTRAM) 50 MG tablet Take 50 mg by mouth every 6 (six) hours as needed for moderate  pain.     cetirizine (ZYRTEC) 10 MG chewable tablet Chew 10 mg by mouth daily. (Patient not taking: Reported on 06/19/2024)     methocarbamol  (ROBAXIN ) 500 MG tablet Take 1 tablet (500 mg total) by mouth every 8 (eight) hours as needed for muscle spasms. (Patient not taking: Reported on 06/19/2024) 40 tablet 1   sertraline (ZOLOFT) 50 MG tablet Take 50 mg by mouth daily. (Patient not taking: Reported on 06/19/2024)     No current facility-administered medications on file prior to visit.    Review of Systems:  As per HPI- otherwise negative.   Physical Examination: Vitals:   06/19/24 1103  BP: (!) 142/90  Pulse: 79  Temp: 98.7 F (37.1 C)  SpO2: 99%   Vitals:   06/19/24 1103  Weight: (!) 339 lb 3.2 oz (153.9 kg)  Height: 5' 3 (1.6 m)   Body mass index is 60.09 kg/m. Ideal Body Weight: Weight in (lb) to have BMI = 25: 140.8  GEN: no acute distress.  Severely obese, otherwise looks well HEENT: Atraumatic, Normocephalic. Bilateral TM wnl, oropharynx normal.  PEERL,EOMI.   Ears and Nose: No external deformity. CV: RRR, No M/G/R. No JVD. No thrill. No extra heart sounds. PULM: CTA B, no wheezes, crackles, rhonchi. No retractions. No resp. distress. No accessory muscle use. ABD: S, NT, ND, +BS. No rebound. No HSM. EXTR: No c/c/e PSYCH: Normally interactive. Conversant.   Results for orders placed or performed in visit on 06/19/24  POC COVID-19 BinaxNow   Collection Time: 06/19/24  11:40 AM  Result Value Ref Range   SARS Coronavirus 2 Ag Negative Negative  POCT Influenza A/B   Collection Time: 06/19/24 11:40 AM  Result Value Ref Range   Influenza A, POC Negative Negative   Influenza B, POC Negative Negative    Assessment and Plan: Body aches - Plan: POC COVID-19 BinaxNow, POCT Influenza A/B  Malaise - Plan: POC COVID-19 BinaxNow, POCT Influenza A/B  Acute cough - Plan: DG Chest 2 View, albuterol  (VENTOLIN  HFA) 108 (90 Base) MCG/ACT inhaler  Assessment & Plan Acute viral respiratory infection with cough, malaise, and pleuritic chest discomfort Symptoms consistent with acute viral respiratory infection. Negative COVID-19 and influenza tests. Persistent cough, no fever. - Ordered chest x-ray to evaluate underlying issues. - Prescribed albuterol  inhaler, two puffs every six hours as needed. - Provided work note for absence on Monday and Tuesday, potential return on Wednesday. She is asked to please let me know if not feeling better in the next couple of days, sooner if she is getting worse Signed Harlene Schroeder, MD  Received chest film, message to patient DG Chest 2 View Result Date: 06/19/2024 CLINICAL DATA:  Cough and malaise for 2 days. EXAM: CHEST - 2 VIEW COMPARISON:  04/24/2024 FINDINGS: The heart size and mediastinal contours are within normal limits. Both lungs are clear. The visualized skeletal structures are unremarkable. IMPRESSION: No active cardiopulmonary disease. Electronically Signed   By: Norleen DELENA Kil M.D.   On: 06/19/2024 12:03

## 2024-06-19 NOTE — Patient Instructions (Signed)
 Please stop by imaging for a chest x-ray Otherwise recommend rest, mucinex otc, and I gave you an albuterol  inhaler to use as needed Let me know if you are not feeling better in the next couple of days!

## 2024-06-20 ENCOUNTER — Ambulatory Visit: Admitting: Nurse Practitioner

## 2024-06-20 ENCOUNTER — Encounter: Payer: Self-pay | Admitting: Nurse Practitioner

## 2024-06-20 VITALS — BP 136/87 | HR 78 | Temp 98.0°F | Ht 63.0 in | Wt 330.0 lb

## 2024-06-20 DIAGNOSIS — E039 Hypothyroidism, unspecified: Secondary | ICD-10-CM | POA: Diagnosis not present

## 2024-06-20 DIAGNOSIS — R7303 Prediabetes: Secondary | ICD-10-CM | POA: Diagnosis not present

## 2024-06-20 DIAGNOSIS — Z6841 Body Mass Index (BMI) 40.0 and over, adult: Secondary | ICD-10-CM | POA: Diagnosis not present

## 2024-06-20 NOTE — Progress Notes (Signed)
 Office: [phone]  /  Fax: [phone]   Initial Visit  Monique Duncan was seen in clinic today to evaluate for obesity. She is interested in losing weight to improve overall health and reduce the risk of weight related complications. She presents today to review program treatment options, initial physical assessment, and evaluation.     She was referred by: PCP  When asked what else they would like to accomplish? She states: Adopt a healthier eating pattern and lifestyle, Improve energy levels and physical activity, Improve existing medical conditions, and Improve quality of life  She was overweight as a child-middle school.  She started gaining excess weight in high school. She has struggled with her weight as an adult.    Wants to lose weight to be able to proceed with left knee surgery  When asked how has your weight affected you? She states: Contributed to medical problems, Contributed to orthopedic problems or mobility issues, Having fatigue, and Having poor endurance  Some associated conditions: allergies, migraines, hypothyroidism, OA of left knee, depression, pre diabetes, OSAS not on CPAP  Contributing factors: family history of obesity, use of obesogenic medications: Psychotropic medications, moderate to high levels of stress, and chronic skipping of meals  Weight promoting medications identified: Psychotropic medications  Current nutrition plan: None  Current level of physical activity: None-has an active job-work at [employer]  Current or previous pharmacotherapy: Phentermine  Response to medication: Ineffective so it was discontinued   Past medical history includes:   Past Medical History:  Diagnosis Date   Anxiety    Cellulitis    Depression    Headache    pt denies    Hypothyroidism    Sleep apnea    mild no cpap      Objective:   BP 136/87   Pulse 78   Temp 98 F (36.7 C)   Ht 5' 3 (1.6 m)   Wt (!) 330 lb (149.7 kg)   LMP 05/19/2024  (Approximate)   SpO2 99%   BMI 58.46 kg/m  She was weighed on the bioimpedance scale: Body mass index is 58.46 kg/m.  Peak Weight:375 lbs , Body Fat%:60.5%, Visceral Fat Rating:24, Weight trend over the last 12 months: Increasing  General:  Alert, oriented and cooperative. Patient is in no acute distress.  Respiratory: Normal respiratory effort, no problems with respiration noted   Gait: able to ambulate independently  Mental Status: Normal mood and affect. Normal behavior. Normal judgment and thought content.   DIAGNOSTIC DATA REVIEWED:  BMET    Component Value Date/Time   NA 139 12/07/2016 1747   K 3.1 (L) 12/07/2016 1747   CL 106 12/07/2016 1747   CO2 23 12/07/2016 1747   GLUCOSE 95 12/07/2016 1747   BUN 7 12/07/2016 1747   CREATININE 0.72 12/07/2016 1747   CALCIUM 9.1 12/07/2016 1747   GFRNONAA >60 12/07/2016 1747   GFRAA >60 12/07/2016 1747   Lab Results  Component Value Date   HGBA1C 5.0 03/17/2024   No results found for: INSULIN CBC    Component Value Date/Time   WBC 7.9 10/22/2020 1319   RBC 4.93 10/22/2020 1319   HGB 13.6 10/22/2020 1319   HCT 43.8 10/22/2020 1319   PLT 255 10/22/2020 1319   MCV 88.8 10/22/2020 1319   MCH 27.6 10/22/2020 1319   MCHC 31.1 10/22/2020 1319   RDW 14.4 10/22/2020 1319   Iron/TIBC/Ferritin/ %Sat No results found for: IRON, TIBC, FERRITIN, IRONPCTSAT Lipid Panel  No results found for: CHOL,  TRIG, HDL, CHOLHDL, VLDL, LDLCALC, LDLDIRECT Hepatic Function Panel     Component Value Date/Time   PROT 7.4 12/07/2016 1747   ALBUMIN 4.0 12/07/2016 1747   AST 21 12/07/2016 1747   ALT 20 12/07/2016 1747   ALKPHOS 52 12/07/2016 1747   BILITOT 1.2 12/07/2016 1747      Component Value Date/Time   TSH 3.320 03/17/2024 1118     Assessment and Plan:   Pre-diabetes Will continue to monitor.    Hypothyroidism, unspecified type Managed by PCP. Continue to follow up with PCP.  Continue meds as  directed  Morbid obesity (HCC)  BMI 50.0-59.9, adult (HCC)        Obesity Treatment / Action Plan:  Patient will work on garnering support from family and friends to begin weight loss journey. Will work on eliminating or reducing the presence of highly palatable, calorie dense foods in the home. Will complete provided nutritional and psychosocial assessment questionnaire before the next appointment. Will be scheduled for indirect calorimetry to determine resting energy expenditure in a fasting state.  This will allow us  to create a reduced calorie, high-protein meal plan to promote loss of fat mass while preserving muscle mass. Counseled on the health benefits of losing 5%-15% of total body weight. Was counseled on nutritional approaches to weight loss and benefits of reducing processed foods and consuming plant-based foods and high quality protein as part of nutritional weight management. Was counseled on pharmacotherapy and role as an adjunct in weight management.   Obesity Education Performed Today:  She was weighed on the bioimpedance scale and results were discussed and documented in the synopsis.  We discussed obesity as a disease and the importance of a more detailed evaluation of all the factors contributing to the disease.  We discussed the importance of long term lifestyle changes which include nutrition, exercise and behavioral modifications as well as the importance of customizing this to her specific health and social needs.  We discussed the benefits of reaching a healthier weight to alleviate the symptoms of existing conditions and reduce the risks of the biomechanical, metabolic and psychological effects of obesity.  Monique Duncan appears to be in the action stage of change and states they are ready to start intensive lifestyle modifications and behavioral modifications.  30 minutes was spent today on this visit including the above counseling, pre-visit chart review,  and post-visit documentation.  Reviewed by clinician on day of visit: allergies, medications, problem list, medical history, surgical history, family history, social history, and previous encounter notes pertinent to obesity diagnosis.    Monique Duncan Monique Dannenberg FNP-C

## 2024-06-27 ENCOUNTER — Ambulatory Visit (INDEPENDENT_AMBULATORY_CARE_PROVIDER_SITE_OTHER): Admitting: Licensed Clinical Social Worker

## 2024-06-27 DIAGNOSIS — F411 Generalized anxiety disorder: Secondary | ICD-10-CM

## 2024-06-27 DIAGNOSIS — F338 Other recurrent depressive disorders: Secondary | ICD-10-CM | POA: Diagnosis not present

## 2024-06-27 NOTE — Progress Notes (Unsigned)
 THERAPIST PROGRESS NOTE  Session Time: 11:00 am-11:45 am  Type of Therapy: Individual Therapy  Session#5   Treatment Goals: Monique Duncan will manage anxiety and mood as evidenced by increasing coping skills, identify and challenge anxious thoughts, not feel like a burden to her family, and adjusting to daily life again for 5 out of 7 days for 60 days.   Session Goals: Anxiety, Exceptions  Behavior: Patient noted that she has been working a lot and getting along with family. Patient was oriented x4 (person, place, situation, and time) . Patient was  Anxious. Patient was Casual. Patient made minimal progress on her goals at this time.   Interventions: Therapist used SFBT interventions to identify exceptions to anxiety.    Response: Patient has been working. She is experiencing stress related to work but not anxiety. 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 She was a little anxious doing it but did it anyway. Patient has been getting along with her co-workers including her female co-workers. She feels like her PO is available to her and she is able to talk to her about how things are. 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 When she walks through the door of her home she automatically starts to empty her pockets. Patient is trying to reframe this as preparing to relax after work rather than preparing to be searched.   Patient engaged in session. Patient responded well to interventions. Patient continues to meet criteria for No diagnosis found.  Patient will continue in outpatient therapy due to being the least restrictive service to meet her needs.   Plan: Patient will return in 1-2 weeks. Patient will continue to regulate her anxiety and expose herself to anxiety inducing situations to increase distress tolerance.   Suicidal/Homicidal:  No without intent/plan    Feelings of depression and/or anhedonia  Protective Factors: positive social support, responsibility to others (children, family), and hope for the future  Collaboration of Care: Other patient got a new psychiatrist.  Patient/Guardian was advised Release of Information must be obtained prior to any record release in order to collaborate their care with an outside provider. Patient/Guardian was advised if they have not already done so to contact the registration department to sign all necessary forms in order for us  to release information regarding their care.   Consent: Patient/Guardian gives verbal consent for treatment and assignment of benefits for services provided during this visit. Patient/Guardian expressed understanding and agreed to proceed.

## 2024-06-28 ENCOUNTER — Ambulatory Visit: Payer: Self-pay

## 2024-06-28 NOTE — Telephone Encounter (Signed)
 FYI Only or Action Required?: FYI only for provider: appointment scheduled on 12/4.  Patient was last seen in primary care on 06/20/2024 by Becki Krabbe, FNP.  Called Nurse Triage reporting URI.  Symptoms began several days ago.  Interventions attempted: OTC medications: sinus meds.  Symptoms are: gradually worsening.  Triage Disposition: See Physician Within 24 Hours  Patient/caregiver understands and will follow disposition?: Yes  Copied from CRM #[id number]. Topic: Clinical - Red Word Triage >> Jun 28, 2024  3:44 PM Monique Duncan wrote: Red Word that prompted transfer to Nurse Triage: Sick for over a week, getting worse, pt is experiencing congestion, runny nose, loss of voice, bodyache, no fever. Has tried OTC medication but it is not working. Was sent home from work Reason for Disposition  Fever present > 3 days (72 hours)  Answer Assessment - Initial Assessment Questions OTC chest congestion Max strength, robitussin. Albuterol  inhaler and helped a little bit. Breathing heavy due to congestion.  1. LOCATION: Where does it hurt?      Headaches from congestion and coughing  2. ONSET: When did the sinus pain start?  (e.g., hours, days)      About a week  3. SEVERITY: How bad is the pain?   (Scale 0-10; or none, mild, moderate or severe)     7/10 4. RECURRENT SYMPTOM: Have you ever had sinus problems before? If Yes, ask: When was the last time? and What happened that time?      Allergies but this is more 5. NASAL CONGESTION: Is the nose blocked? If Yes, ask: Can you open it or must you breathe through your mouth?     Very congested 6. NASAL DISCHARGE: Do you have discharge from your nose? If so ask, What color?     Green yellow thick  7. FEVER: Do you have a fever? If Yes, ask: What is it, how was it measured, and when did it start?      Chills and night sweats  8. OTHER SYMPTOMS: Do you have any other symptoms? (e.g., sore throat, cough, earache,  difficulty breathing)     Body aches, breathing heavy through her mouth, voice is hoarse.  9. PREGNANCY: Is there any chance you are pregnant? When was your last menstrual period?     Denies  Protocols used: Sinus Pain or Congestion-A-AH

## 2024-06-29 ENCOUNTER — Encounter: Payer: Self-pay | Admitting: Urgent Care

## 2024-06-29 ENCOUNTER — Ambulatory Visit: Admitting: Urgent Care

## 2024-06-29 VITALS — BP 139/83 | HR 81 | Temp 97.9°F | Ht 63.0 in | Wt 332.0 lb

## 2024-06-29 DIAGNOSIS — J309 Allergic rhinitis, unspecified: Secondary | ICD-10-CM | POA: Diagnosis not present

## 2024-06-29 DIAGNOSIS — J40 Bronchitis, not specified as acute or chronic: Secondary | ICD-10-CM | POA: Diagnosis not present

## 2024-06-29 DIAGNOSIS — R0981 Nasal congestion: Secondary | ICD-10-CM

## 2024-06-29 MED ORDER — IPRATROPIUM BROMIDE 0.03 % NA SOLN: 2.0000 | Freq: Two times a day (BID) | NASAL | 0 refills | Status: AC

## 2024-06-29 MED ORDER — MONTELUKAST SODIUM 10 MG PO TABS: 10.0000 mg | ORAL_TABLET | Freq: Every day | ORAL | 3 refills | Status: AC

## 2024-06-29 MED ORDER — PREDNISONE 20 MG PO TABS: 20.0000 mg | ORAL_TABLET | Freq: Every day | ORAL | 0 refills | Status: AC

## 2024-06-29 NOTE — Patient Instructions (Addendum)
 Continue your xyzal once daily to help clear up the mucous. Continue your mucinex twice daily with plenty of water.   Start atrovent nasal spray to help with congestion.  It is also recommended that you use nasal saline/ sinus washes to cleans the sinus passages.  Hot steam from a shower or vaporizer may also be beneficial to help open up the upper airway. Eucalyptus can be helpful.  Restart your singulair  nightly for the next 14 days. Continue your as needed albuterol . Add low dose prednisone  with breakfast for the next 5 days.

## 2024-06-29 NOTE — Progress Notes (Unsigned)
 Established Patient Office Visit  Subjective:  Patient ID: Monique Duncan, female    DOB: July 08, 1986  Age: 38 y.o. MRN: [id number]  Chief Complaint  Patient presents with   Cough    X 1 week with stuffy nose; COVID/flu negative last week    HPI  Discussed the use of AI scribe software for clinical note transcription with the patient, who gave verbal consent to proceed.  History of Present Illness   Monique Duncan is a 38 year old female who presents with respiratory symptoms and nasal congestion.  She has been experiencing respiratory symptoms since June 17, 2024. She visited another provider on June 19, 2024, where she tested negative for COVID-19 and flu, and a chest x-ray was performed. She was prescribed an albuterol  inhaler, which she uses approximately three times a day, two puffs each time, and it helps with her breathing, especially given her heavier body weight.  Her symptoms include heavy breathing, particularly when active, and a tight chest when coughing. No chest tightness when not coughing and no audible wheezing, but she notes a 'crackling' sound. Her cough is productive with greenish-yellow phlegm, but no blood. No fever, shortness of breath at rest, or difficulty lying flat at night.  She experiences significant nasal congestion, which she describes as 'miserable,' and uses her inhaler to help with breathing. No postnasal drainage, but she uses Mucinex and Robitussin for her symptoms. She is not currently taking Singulair  as it was not effective for her, and she continues to use hydroxyzine as needed.  She denies any chest pain or sinus pressure. She notes increased fatigue over the past week. She denies having diabetes and does not check her blood sugars at home.      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   Tear of medial meniscus of knee 08/29/2020   Chronic headaches 10/01/2019   Hypothyroidism 10/01/2019   Moderate episode of recurrent major depressive disorder (HCC) 10/01/2019   Morbid obesity with BMI of 60.0-69.9, adult (HCC) 10/01/2019   Neuropathic pain 10/01/2019   Pain in left knee 06/30/2019   Laceration of thumb 11/09/2017   New daily persistent headache 03/11/2015   Migraine without aura and without status migrainosus, not intractable 03/11/2015   White matter abnormality on MRI of brain 03/11/2015   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   Cellulitis and abscess of trunk 07/17/2014   Abdominal pain 04/10/2014   Biliary dyskinesia 04/10/2014   Uterine fibroid 04/10/2014   Morbid obesity (HCC) 04/10/2014   Past Medical History:  Diagnosis Date   Anxiety    Cellulitis    Depression    Headache    pt denies    Hypothyroidism    Sleep apnea    mild no cpap    Past Surgical History:  Procedure Laterality Date   CHOLECYSTECTOMY N/A 04/11/2014   Procedure: LAPAROSCOPIC CHOLECYSTECTOMY;  Surgeon: Lynda Leos, MD;  Location: [facility];  Service: General;  Laterality: N/A;   KNEE ARTHROSCOPY Left 10/29/2020   Procedure: ARTHROSCOPY KNEE;  Surgeon: Kay Kemps, MD;  Location: [facility];  Service: Orthopedics;  Laterality: Left;   MYRINGOTOMY WITH TUBE PLACEMENT     naval removal     TONSILLECTOMY  UTERINE FIBROID SURGERY     Social History   Tobacco Use   Smoking status: Never    Passive exposure: Current   Smokeless tobacco: Never  Vaping Use   Vaping status: Never Used  Substance Use Topics   Alcohol use: No    Alcohol/week: 0.0 standard drinks of alcohol   Drug use: No      ROS: as noted in HPI  Objective:     BP 139/83   Pulse 81   Temp 97.9 F (36.6 C) (Oral)   Ht 5' 3  (1.6 m)   Wt (!) 332 lb (150.6 kg)   LMP 05/19/2024 (Approximate)   SpO2 99%   BMI 58.81 kg/m  BP Readings from Last 3 Encounters:  06/29/24 139/83  06/20/24 136/87  06/19/24 (!) 142/90   Wt Readings from Last 3 Encounters:  06/29/24 (!) 332 lb (150.6 kg)  06/20/24 (!) 330 lb (149.7 kg)  06/19/24 (!) 339 lb 3.2 oz (153.9 kg)      Physical Exam Vitals and nursing note reviewed.  Constitutional:      General: She is not in acute distress.    Appearance: Normal appearance. She is obese. She is not ill-appearing, toxic-appearing or diaphoretic.  HENT:     Head: Normocephalic and atraumatic.     Right Ear: Tympanic membrane, ear canal and external ear normal. There is no impacted cerumen.     Left Ear: Tympanic membrane, ear canal and external ear normal. There is no impacted cerumen.     Nose: Congestion present.     Mouth/Throat:     Mouth: Mucous membranes are moist.     Pharynx: Oropharynx is clear. No oropharyngeal exudate or posterior oropharyngeal erythema.  Eyes:     General: No scleral icterus.       Right eye: No discharge.        Left eye: No discharge.     Extraocular Movements: Extraocular movements intact.     Pupils: Pupils are equal, round, and reactive to light.  Cardiovascular:     Rate and Rhythm: Normal rate and regular rhythm.     Heart sounds: No murmur heard. Pulmonary:     Effort: Pulmonary effort is normal. No respiratory distress.     Breath sounds: Normal breath sounds. No stridor. No wheezing, rhonchi or rales.  Chest:     Chest wall: No tenderness.  Musculoskeletal:     Cervical back: Normal range of motion and neck supple. No rigidity or tenderness.  Lymphadenopathy:     Cervical: No cervical adenopathy.  Skin:    General: Skin is warm and dry.     Coloration: Skin is not jaundiced.     Findings: No bruising, erythema or rash.  Neurological:     General: No focal deficit present.     Mental Status: She is alert and oriented to person,  place, and time.  Psychiatric:        Mood and Affect: Mood normal.        Behavior: Behavior normal.      No results found for any visits on 06/29/24.  Last CBC Lab Results  Component Value Date   WBC 7.9 10/22/2020   HGB 13.6 10/22/2020   HCT 43.8 10/22/2020   MCV 88.8 10/22/2020   MCH 27.6 10/22/2020   RDW 14.4 10/22/2020   PLT 255 10/22/2020   Last metabolic panel Lab Results  Component Value Date   GLUCOSE 95 12/07/2016   NA 139 12/07/2016  K 3.1 (L) 12/07/2016   CL 106 12/07/2016   CO2 23 12/07/2016   BUN 7 12/07/2016   CREATININE 0.72 12/07/2016   GFRNONAA >60 12/07/2016   CALCIUM 9.1 12/07/2016   PROT 7.4 12/07/2016   ALBUMIN 4.0 12/07/2016   BILITOT 1.2 12/07/2016   ALKPHOS 52 12/07/2016   AST 21 12/07/2016   ALT 20 12/07/2016   ANIONGAP 10 12/07/2016   Last lipids No results found for: CHOL, HDL, LDLCALC, LDLDIRECT, TRIG, CHOLHDL Last hemoglobin A1c Lab Results  Component Value Date   HGBA1C 5.0 03/17/2024   Last thyroid functions Lab Results  Component Value Date   TSH 3.320 03/17/2024   FREET4 1.00 03/17/2024   Last vitamin D No results found for: 25OHVITD2, 25OHVITD3, VD25OH Last vitamin B12 and Folate No results found for: VITAMINB12, FOLATE    The ASCVD Risk score (Arnett DK, et al., 2019) failed to calculate for the following reasons:   The 2019 ASCVD risk score is only valid for ages 24 to 73  Assessment & Plan:  Tracheobronchitis -     Montelukast  Sodium; Take 1 tablet (10 mg total) by mouth at bedtime.  Dispense: 30 tablet; Refill: 3 -     predniSONE ; Take 1 tablet (20 mg total) by mouth daily with breakfast for 5 days.  Dispense: 5 tablet; Refill: 0 -     Ipratropium Bromide ; Place 2 sprays into both nostrils every 12 (twelve) hours for 3 days.  Dispense: 30 mL; Refill: 0  Allergic rhinitis, unspecified seasonality, unspecified trigger -     Montelukast  Sodium; Take 1 tablet (10 mg total) by mouth at  bedtime.  Dispense: 30 tablet; Refill: 3  Nasal congestion -     Ipratropium Bromide ; Place 2 sprays into both nostrils every 12 (twelve) hours for 3 days.  Dispense: 30 mL; Refill: 0   Assessment and Plan    Tracheobronchitis Productive cough with greenish-yellow sputum and nasal congestion. No bacterial infection signs on exam. Albuterol  provides relief. Prednisone  and Singulair  considered for symptom relief. - Continue albuterol  inhaler as needed. - Prescribed Singulair  14 tablets, to be taken at nighttime. - Prescribed a short course of prednisone . - Continue Mucinex [phone] mg twice a day. - Discontinue Robitussin. - Encourage hydration to thin mucus. - Use steam inhalation for nasal congestion. - Use cold air for chest tightness.  Allergic rhinitis Nasal congestion and postnasal drip. Previous Singulair  use  - Prescribed nasal spray for nasal congestion.        No follow-ups on file.   Benton LITTIE Gave, PA

## 2024-07-05 ENCOUNTER — Encounter: Payer: Self-pay | Admitting: Family Medicine

## 2024-07-05 ENCOUNTER — Other Ambulatory Visit: Payer: Self-pay | Admitting: Family Medicine

## 2024-07-09 ENCOUNTER — Other Ambulatory Visit: Payer: Self-pay | Admitting: Family Medicine

## 2024-07-09 DIAGNOSIS — M25512 Pain in left shoulder: Secondary | ICD-10-CM

## 2024-07-11 ENCOUNTER — Ambulatory Visit: Admitting: Bariatrics

## 2024-07-11 ENCOUNTER — Encounter: Payer: Self-pay | Admitting: Bariatrics

## 2024-07-11 ENCOUNTER — Other Ambulatory Visit: Payer: Self-pay | Admitting: Family Medicine

## 2024-07-11 VITALS — BP 109/72 | HR 87 | Ht 63.0 in | Wt 338.0 lb

## 2024-07-11 DIAGNOSIS — R5383 Other fatigue: Secondary | ICD-10-CM

## 2024-07-11 DIAGNOSIS — R0683 Snoring: Secondary | ICD-10-CM

## 2024-07-11 DIAGNOSIS — R0602 Shortness of breath: Secondary | ICD-10-CM

## 2024-07-11 DIAGNOSIS — E559 Vitamin D deficiency, unspecified: Secondary | ICD-10-CM

## 2024-07-11 DIAGNOSIS — R7303 Prediabetes: Secondary | ICD-10-CM

## 2024-07-11 DIAGNOSIS — E039 Hypothyroidism, unspecified: Secondary | ICD-10-CM

## 2024-07-11 DIAGNOSIS — Z0000 Encounter for general adult medical examination without abnormal findings: Secondary | ICD-10-CM

## 2024-07-11 DIAGNOSIS — Z6841 Body Mass Index (BMI) 40.0 and over, adult: Secondary | ICD-10-CM

## 2024-07-11 DIAGNOSIS — R051 Acute cough: Secondary | ICD-10-CM

## 2024-07-11 DIAGNOSIS — Z1331 Encounter for screening for depression: Secondary | ICD-10-CM

## 2024-07-11 NOTE — Progress Notes (Signed)
 At a Glance:  Vitals BP: 109/72 Pulse Rate: 87 SpO2: 96 %   Anthropometric Measurements Height: 5' 3 (1.6 m) Weight: (!) 338 lb (153.3 kg) BMI (Calculated): 59.89 Starting Weight: 338lb Peak Weight: 373lb Waist Measurement : 58 inches   Body Composition  Body Fat %: 63.63 % Fat Mass (lbs): 215 lbs Muscle Mass (lbs): 116.8 lbs Visceral Fat Rating : 26   Other Clinical Data RMR: 2261 Fasting: yes Labs: yes Today's Visit #: 1 Starting Date: 07/11/24    EKG: Normal sinus rhythm, rate 81.   Indirect Calorimeter:   Resting Metabolic Rate ( RMR):  RMR (actual): 2261 kcal RMR (calculated): 1936 kcal The calculated basal metabolic rate is 8484 kcal thus her basal metabolic rate is worse than expected.  Plan:   Indirect calorimeter completed, interpreted and reviewed with patient today and allowed to ask questions.  Discussed the implications for the chosen plan and exercise based on the RMR reading.  Will consider repeating the RMR in the future based on weight loss.    Chief Complaint:  Obesity   Subjective:  Monique Duncan (MR# [id number]) is a 38 y.o. female who presents for evaluation and treatment of obesity and related comorbidities.   Monique Duncan is currently in the action stage of change and ready to dedicate time achieving and maintaining a healthier weight. Monique Duncan is interested in becoming our patient and working on intensive lifestyle modifications including (but not limited to) diet and exercise for weight loss.  Monique Duncan has been struggling with her weight. She has been unsuccessful in either losing weight, maintaining weight loss, or reaching her healthy weight goal.  Monique Duncan's habits were reviewed today and are as follows: she has been heavy most of her life, she started gaining weight in middle school , she is a picky eater and doesn't like to eat healthier foods, she skips meals frequently, she has binge eating behaviors, and she struggles with  emotional eating.  Current or previous pharmacotherapy: Phentermine  Response to medication: Ineffective so it was discontinued  Other Fatigue Monique Duncan admits to daytime somnolence and admits to waking up still tired. Monique Duncan generally gets 6 hours of sleep per night, and states that she has difficulty falling asleep and difficulty falling back asleep if awakened. Snoring is present. Apneic episodes are not present. Epworth Sleepiness Score is 7.   Shortness of Breath Monique Duncan notes increasing shortness of breath with exercising and seems to be worsening over time with weight gain. She notes getting out of breath sooner with activity than she used to. This has not gotten worse recently. Monique Duncan denies shortness of breath at rest or orthopnea.  Depression Screen Monique Duncan's Food and Mood (modified PHQ-9) score was 8. 5-9 mild depression     07/11/2024    7:57 AM  Depression screen PHQ 2/9  Decreased Interest 0  Down, Depressed, Hopeless 1  PHQ - 2 Score 1  Altered sleeping 3  Tired, decreased energy 2  Change in appetite 2  Feeling bad or failure about yourself  0  Trouble concentrating 0  Moving slowly or fidgety/restless 0  Suicidal thoughts 0  PHQ-9 Score 8  Difficult doing work/chores Somewhat difficult     Assessment and Plan:   Other Fatigue Monique Duncan does feel that her weight is causing her energy to be lower than it should be. Fatigue may be related to obesity, depression or many other causes. Labs will be ordered, and in the meanwhile, Monique Duncan will focus on self care including making healthy  food choices, increasing physical activity and focusing on stress reduction.  Shortness of Breath Monique Duncan does not feel that she gets out of breath more easily that she used to when she exercises. Monique Duncan's shortness of breath appears to be obesity related and exercise induced. She has agreed to work on weight loss and gradually increase exercise to treat her exercise induced shortness of  breath. Will continue to monitor closely.  Health Maintenance:   Obesity   Plan: Will do EKG, indirect calorimetry, and labs.     Vitamin D  Deficiency She is at risk for vitamin D  deficiency due to obesity.  She is not on vitamin D    Plan: Will check for vitamin D  deficiency.   Monique Duncan had a positive depression screening. Depression is commonly associated with obesity and often results in emotional eating behaviors. We will monitor this closely and work on CBT to help improve the non-hunger eating patterns. Referral to Psychology may be required if no improvement is seen as she continues in our clinic.   Prediabetes Last A1c was 5.0  Medication(s): none Lab Results  Component Value Date   HGBA1C 5.0 03/17/2024   No results found for: INSULIN   Plan: Will minimize all refined carbohydrates both sweets and starches.  Will work on the plan and exercise.  Consider both aerobic and resistance training.  Will keep protein, water, and fiber intake high.  Will check a hemoglobin A1c and an insulin  level today.  Hypothyroidism Stable.  Does not report symptoms associated with uncontrolled hypothyroidism. Medication(s): Levothyroxine  100 mcg daily. Lab Results  Component Value Date   TSH 3.320 03/17/2024    Plan: Continue levothyroxine  at current dose. Counseling: The correct way to take levothyroxine  is fasting, with water, separated by at least 30 minutes from breakfast, and separated by more than 4 hours from calcium, iron, multivitamins, acid reflux medications (PPIs).    Snoring:   She states that she has a previous history of obstructive sleep apnea but due to some complications and insurance and ever factors that she never started a CPAP.  She continues to snore and believes that her sleep is very poor.   Plan:  Will send for a sleep study.   Previous labs reviewed today. No recent labs noted.   Labs done today CMP, Lipids, Insulin , HgbA1c, Vit D, Thyroid Panel,  and CBC   Morbid Obesity: BMI (Calculated): 59.89   Monique Duncan is currently in the action stage of change and her goal is to begin weight loss efforts. I recommend Monique Duncan begin the structured treatment plan as follows:  She has agreed to Category 4 Plan  Exercise goals: All adults should avoid inactivity. Some activity is better than none, and adults who participate in any amount of physical activity, gain some health benefits.  Behavioral modification strategies:increasing lean protein intake, decreasing simple carbohydrates, increasing vegetables, increase H2O intake, increase high fiber foods, no skipping meals, meal planning and cooking strategies, keeping healthy foods in the home, avoiding temptations, and planning for success  She was informed of the importance of frequent follow-up visits to maximize her success with intensive lifestyle modifications for her multiple health conditions. She was informed we would discuss her lab results at her next visit unless there is a critical issue that needs to be addressed sooner. Monique Duncan agreed to keep her next visit at the agreed upon time to discuss these results.  Objective:  General: Cooperative, alert, well developed, in no acute distress. HEENT: Conjunctivae and lids unremarkable. Cardiovascular: Regular rhythm.  Lungs: Normal work of breathing. Neurologic: No focal deficits.   Lab Results  Component Value Date   CREATININE 0.72 12/07/2016   BUN 7 12/07/2016   NA 139 12/07/2016   K 3.1 (L) 12/07/2016   CL 106 12/07/2016   CO2 23 12/07/2016   Lab Results  Component Value Date   ALT 20 12/07/2016   AST 21 12/07/2016   ALKPHOS 52 12/07/2016   BILITOT 1.2 12/07/2016   Lab Results  Component Value Date   HGBA1C 5.0 03/17/2024   No results found for: INSULIN  Lab Results  Component Value Date   TSH 3.320 03/17/2024   No results found for: CHOL, HDL, LDLCALC, LDLDIRECT, TRIG, CHOLHDL Lab Results  Component Value  Date   WBC 7.9 10/22/2020   HGB 13.6 10/22/2020   HCT 43.8 10/22/2020   MCV 88.8 10/22/2020   PLT 255 10/22/2020   No results found for: IRON, TIBC, FERRITIN  Attestation Statements:  Applicable history such as the following:  allergies, medications, problem list, medical history, surgical history, family history, social history, and previous encounter notes reviewed by clinician on day of visit:  Time spent on visit in care of the patient today including the items listed below was 45 minutes.    20 minutes were spent talking about the history, 25 minutes for face to face counseling implementing the plan, discussing the specifics of how to arrange meals, meal planning, water intake.   I spent face to face time discussing his/her plan, including breakfast, additional breakfast options, lunch, and dinner options, grocery list, and snacks.  I reviewed her indirect calorimetry. I discussed the implications for the diet plan.    Discussed the bio-impedence test (fat %, muscle mass, and water weight) and allowed the patient to ask questions.   Discussed the following information sheets: Category 4, 100 Calorie Snacks, 200 Calorie Snacks, Microwave Meals, and Protein Shake Sheets. .   I looked  for labs on My Chart and Care Everywhere,  but no recent labs.   I additionally spent time documenting, reviewing, and checking the codes before submitting.   This may have been prepared with the assistance of Engineer, Civil (consulting).  Occasional wrong-word or sound-a-like substitutions may have occurred due to the inherent limitations of voice recognition software.    Clayborne Daring, DO

## 2024-07-12 ENCOUNTER — Ambulatory Visit (INDEPENDENT_AMBULATORY_CARE_PROVIDER_SITE_OTHER): Payer: Self-pay | Admitting: Bariatrics

## 2024-07-12 LAB — HEMOGLOBIN A1C
Est. average glucose Bld gHb Est-mCnc: 111 mg/dL
Hgb A1c MFr Bld: 5.5 % (ref 4.8–5.6)

## 2024-07-12 LAB — COMPREHENSIVE METABOLIC PANEL WITH GFR
ALT: 15 IU/L (ref 0–32)
AST: 15 IU/L (ref 0–40)
Albumin: 3.7 g/dL — ABNORMAL LOW (ref 3.9–4.9)
Alkaline Phosphatase: 75 IU/L (ref 41–116)
BUN/Creatinine Ratio: 18 (ref 9–23)
BUN: 11 mg/dL (ref 6–20)
Bilirubin Total: 0.5 mg/dL (ref 0.0–1.2)
CO2: 24 mmol/L (ref 20–29)
Calcium: 8.8 mg/dL (ref 8.7–10.2)
Chloride: 98 mmol/L (ref 96–106)
Creatinine, Ser: 0.62 mg/dL (ref 0.57–1.00)
Globulin, Total: 2.7 g/dL (ref 1.5–4.5)
Glucose: 87 mg/dL (ref 70–99)
Potassium: 3.4 mmol/L — ABNORMAL LOW (ref 3.5–5.2)
Sodium: 135 mmol/L (ref 134–144)
Total Protein: 6.4 g/dL (ref 6.0–8.5)
eGFR: 118 mL/min/1.73 (ref >59)

## 2024-07-12 LAB — CBC WITH DIFFERENTIAL/PLATELET
Basophils Absolute: 0 x10E3/uL (ref 0.0–0.2)
Basos: 0 %
EOS (ABSOLUTE): 0.4 x10E3/uL (ref 0.0–0.4)
Eos: 6 %
Hematocrit: 36 % (ref 34.0–46.6)
Hemoglobin: 11 g/dL — ABNORMAL LOW (ref 11.1–15.9)
Immature Grans (Abs): 0 x10E3/uL (ref 0.0–0.1)
Immature Granulocytes: 0 %
Lymphocytes Absolute: 2.2 x10E3/uL (ref 0.7–3.1)
Lymphs: 28 %
MCH: 25.9 pg — ABNORMAL LOW (ref 26.6–33.0)
MCHC: 30.6 g/dL — ABNORMAL LOW (ref 31.5–35.7)
MCV: 85 fL (ref 79–97)
Monocytes Absolute: 0.4 x10E3/uL (ref 0.1–0.9)
Monocytes: 5 %
Neutrophils Absolute: 4.7 x10E3/uL (ref 1.4–7.0)
Neutrophils: 60 %
Platelets: 276 x10E3/uL (ref 150–450)
RBC: 4.25 x10E6/uL (ref 3.77–5.28)
RDW: 13.3 % (ref 11.7–15.4)
WBC: 7.8 x10E3/uL (ref 3.4–10.8)

## 2024-07-12 LAB — VITAMIN D 25 HYDROXY (VIT D DEFICIENCY, FRACTURES): Vit D, 25-Hydroxy: 7.5 ng/mL — ABNORMAL LOW (ref 30.0–100.0)

## 2024-07-12 LAB — INSULIN, RANDOM: INSULIN: 11.2 u[IU]/mL (ref 2.6–24.9)

## 2024-07-12 LAB — TSH+T4F+T3FREE
Free T4: 0.98 ng/dL (ref 0.82–1.77)
T3, Free: 3.2 pg/mL (ref 2.0–4.4)
TSH: 8.8 u[IU]/mL — ABNORMAL HIGH (ref 0.450–4.500)

## 2024-07-12 LAB — LIPID PANEL WITH LDL/HDL RATIO
Cholesterol, Total: 172 mg/dL (ref 100–199)
HDL: 68 mg/dL (ref >39)
LDL Chol Calc (NIH): 75 mg/dL (ref 0–99)
LDL/HDL Ratio: 1.1 ratio (ref 0.0–3.2)
Triglycerides: 173 mg/dL — ABNORMAL HIGH (ref 0–149)
VLDL Cholesterol Cal: 29 mg/dL (ref 5–40)

## 2024-07-25 ENCOUNTER — Ambulatory Visit: Admitting: Bariatrics

## 2024-07-25 ENCOUNTER — Encounter: Payer: Self-pay | Admitting: Bariatrics

## 2024-07-25 VITALS — BP 139/93 | HR 74 | Temp 98.0°F | Ht 63.0 in | Wt 340.0 lb

## 2024-07-25 DIAGNOSIS — E8809 Other disorders of plasma-protein metabolism, not elsewhere classified: Secondary | ICD-10-CM

## 2024-07-25 DIAGNOSIS — Z6841 Body Mass Index (BMI) 40.0 and over, adult: Secondary | ICD-10-CM | POA: Diagnosis not present

## 2024-07-25 DIAGNOSIS — E559 Vitamin D deficiency, unspecified: Secondary | ICD-10-CM

## 2024-07-25 DIAGNOSIS — E66813 Obesity, class 3: Secondary | ICD-10-CM

## 2024-07-25 DIAGNOSIS — E039 Hypothyroidism, unspecified: Secondary | ICD-10-CM

## 2024-07-25 MED ORDER — VITAMIN D (ERGOCALCIFEROL) 1.25 MG (50000 UNIT) PO CAPS: 50000.0000 [IU] | ORAL_CAPSULE | ORAL | 0 refills | Status: DC

## 2024-07-25 NOTE — Progress Notes (Signed)
 "                                                                                                                        First follow-up after initial visit.        WEIGHT SUMMARY AND BIOMETRICS  Weight Lost Since Last Visit: 0lb  Weight Gained Since Last Visit: 2lb   Vitals Temp: 98 F (36.7 C) BP: (!) 139/93 Pulse Rate: 74 SpO2: 97 %   Anthropometric Measurements Height: 5' 3 (1.6 m) Weight: (!) 340 lb (154.2 kg) BMI (Calculated): 60.24 Weight at Last Visit: 338lb Weight Lost Since Last Visit: 0lb Weight Gained Since Last Visit: 2lb Starting Weight: 338lb Total Weight Loss (lbs): 0 lb (0 kg) Peak Weight: 373lb   Body Composition  Body Fat %: 61.5 % Fat Mass (lbs): 209.2 lbs Muscle Mass (lbs): 124.2 lbs Visceral Fat Rating : 25   Other Clinical Data RMR: 2261 Fasting: No Labs: No Today's Visit #: 2 Starting Date: 07/11/24    OBESITY Monique Duncan is here to discuss her progress with her obesity treatment plan along with follow-up of her obesity related diagnoses.    Nutrition Plan: the Category 4 plan - 50% adherence.  Current exercise: none  Interim History:  She is up 2 lbs since her initial visit.  Eating all of the food on the plan., Protein intake is as prescribed, and Water intake is inadequate.  Initial positives regarding the dietary plan: The plan was not that bad.  Initial challenges regarding  the dietary plan: She misses her lunch, and states that she is choosing good snacks.   Pharmacotherapy: [name] is not on any anti-obesity medications.  Hunger is moderately controlled.  Cravings are moderately controlled.  Assessment/Plan:   Hypothyroidism Stable.  Does not report symptoms associated with uncontrolled hypothyroidism. She is now taking the medication on a consistent basis., but prior to the blood work had missed several days.  Medication(s): Levothyroxine  100 mcg daily. Lab Results  Component Value Date   TSH 8.800 (H) 07/11/2024     Plan: Continue levothyroxine  at current dose. Will recheck her thyroid levels in approximately 1 month after she has been on the levothyroxine  consistently without missed doses. Counseling: The correct way to take levothyroxine  is fasting, with water, separated by at least 30 minutes from breakfast, and separated by more than 4 hours from calcium, iron, multivitamins, acid reflux medications (PPIs).    Vitamin D  Deficiency Vitamin D  is not at goal of 50.  Most recent vitamin D  level was 7.5. She is not on any anti-obesity medications.  Lab Results  Component Value Date   VD25OH 7.5 (L) 07/11/2024    Plan: Begin prescription vitamin D  50,000 IU weekly.  Will eat vitamin D  rich foods.  Hypoalbuminemia:  Her albumin was slightly low.  She admits to not eating enough protein.  Plan:  She will start journaling using one of the apps such as My fitness pal.  We discussed journaling and a  journaling sheet was given and explained.  She will journal on a regular basis and try to eat approximately 1800 cal and 100 to 130 g of protein daily. A protein shake shake was given and she will try to have 1 daily.    Morbid Obesity: Current BMI BMI (Calculated): 60.24   Pharmacotherapy Plan No anti-obesity medications at this time.   [name] is currently in the action stage of change. As such, her goal is to continue with weight loss efforts.  She has agreed to the Category 4 plan.  Exercise goals: All adults should avoid inactivity. Some physical activity is better than none, and adults who participate in any amount of physical activity gain some health benefits. Will consider more physical at activity after she loses some weight.  She will continue to wear her brace on her left knee when she is exercising.  Behavioral modification strategies: increasing lean protein intake, no meal skipping, meal planning , decrease liquid calories, better snacking choices, planning for success, increasing  lower sugar fruits, increasing fiber rich foods, decrease snacking , avoiding temptations, keep healthy foods in the home, weigh protein portions, and measure portion sizes.  [name] has agreed to follow-up with our clinic in 2 weeks.   Labs reviewed today from last visit (CMP, Lipids, HgbA1c, insulin , vitamin D , and thyroid panel).   Will check her TSH, CMP, anemia panel, and ferritin levels in about 1 month.   Objective:   VITALS: Per patient if applicable, see vitals. GENERAL: Alert and in no acute distress. CARDIOPULMONARY: No increased WOB. Speaking in clear sentences.  PSYCH: Pleasant and cooperative. Speech normal rate and rhythm. Affect is appropriate. Insight and judgement are appropriate. Attention is focused, linear, and appropriate.  NEURO: Oriented as arrived to appointment on time with no prompting.   Attestation Statements:   This was prepared with the assistance of Engineer, Civil (consulting).  Occasional wrong-word or sound-a-like substitutions may have occurred due to the inherent limitations of voice recognition software.   Clayborne Daring, DO  "

## 2024-07-28 ENCOUNTER — Ambulatory Visit (HOSPITAL_COMMUNITY): Admitting: Licensed Clinical Social Worker

## 2024-07-28 DIAGNOSIS — F338 Other recurrent depressive disorders: Secondary | ICD-10-CM

## 2024-07-28 DIAGNOSIS — F411 Generalized anxiety disorder: Secondary | ICD-10-CM

## 2024-07-28 NOTE — Progress Notes (Signed)
 THERAPIST PROGRESS NOTE  Session Time: 8:00 am-8:45 am  Type of Therapy: Individual Therapy  Session#7   Treatment Goals: Monique Duncan will manage anxiety and mood as evidenced by increasing coping skills, identify and challenge anxious thoughts, not feel like a burden to her family, and adjusting to daily life again for 5 out of 7 days for 60 days.   Session Goals: Monique Duncan will reduce frequency of avoidant behaviors by 50% as evidenced by self-report in therapy sessions   Behavior: Patient noted the holidays went well. She has been working a lot. Her job will call her in to work or have her stay longer than she is scheduled. She is tired but also is glad that they view her as a dependable employee.  Patient was oriented x4 (person, place, situation, and time) . Patient was  Anxious. Patient was Casual. Patient made moderate progress on her goals at this time.   Interventions: Therapist used CBT interventions of cognitive distortions and challenging the catastrophic thinking.    Response: Patient has continued to adjust to the freedoms and restrictions she has outside of being incarcerated. She can work where she wants, she can drive where she wants, but still has has to be in by 6 pm daily for probation. She continues to struggle with sleep. She is used to being half alert at night due to others she was around during her incarceration, the guards, or having the lights on until 4 am while she was incarcerated. Patient knows she is physically free but is working on not being mentally incarcerated. Patient has not spent time with her female friend yet in person but continues to speak to him on the phone. She know she will be ready to see him in person one day but not now. Patient also noted that she has been working half an hour or longer after her shift. She has had an occasion where only had a half hour to get home which increased her anxiety. She was able to identify what she would do if she thought that she  was going to be late including: contact her probation officer, show proof of her schedule and when she clocked out, and if she is stuck in traffic due to an accident then snap a photo to send as well.   Patient engaged in session. Patient responded well to interventions. Patient continues to meet criteria for Generalized anxiety disorder  Other recurrent depressive disorders  Patient will continue in outpatient therapy due to being the least restrictive service to meet her needs.   Plan: Patient will transition to a new therapist after next session. She has a therapist in mind through her psychiatrist's office. Patient will continue to work on transitioning from incarceration into civilian life including decluttering her room to leave the past behind and start fresh.   Suicidal/Homicidal:  No without intent/plan   Feelings of depression and/or anhedonia  Protective Factors: positive social support, responsibility to others (children, family), and hope for the future  Collaboration of Care: Other patient got a new psychiatrist.  Patient/Guardian was advised Release of Information must be obtained prior to any record release in order to collaborate their care with an outside provider. Patient/Guardian was advised if they have not already done so to contact the registration department to sign all necessary forms in order for us  to release information regarding their care.   Consent: Patient/Guardian gives verbal consent for treatment and assignment of benefits for services provided during this visit. Patient/Guardian expressed understanding  and agreed to proceed.

## 2024-08-02 ENCOUNTER — Other Ambulatory Visit: Payer: Self-pay | Admitting: Family Medicine

## 2024-08-02 DIAGNOSIS — M25512 Pain in left shoulder: Secondary | ICD-10-CM

## 2024-08-08 ENCOUNTER — Ambulatory Visit: Admitting: Bariatrics

## 2024-08-08 ENCOUNTER — Encounter: Payer: Self-pay | Admitting: Bariatrics

## 2024-08-08 VITALS — BP 123/77 | HR 71 | Ht 63.0 in | Wt 338.0 lb

## 2024-08-08 DIAGNOSIS — Z6841 Body Mass Index (BMI) 40.0 and over, adult: Secondary | ICD-10-CM

## 2024-08-08 DIAGNOSIS — E8809 Other disorders of plasma-protein metabolism, not elsewhere classified: Secondary | ICD-10-CM | POA: Diagnosis not present

## 2024-08-08 DIAGNOSIS — E559 Vitamin D deficiency, unspecified: Secondary | ICD-10-CM | POA: Diagnosis not present

## 2024-08-08 MED ORDER — VITAMIN D (ERGOCALCIFEROL) 1.25 MG (50000 UNIT) PO CAPS: 50000.0000 [IU] | ORAL_CAPSULE | ORAL | 0 refills | Status: DC

## 2024-08-08 NOTE — Progress Notes (Signed)
 "                                                                                                             WEIGHT SUMMARY AND BIOMETRICS  Weight Lost Since Last Visit: 2lb  Weight Gained Since Last Visit: 0   Vitals BP: 123/77 Pulse Rate: 71 SpO2: 97 %   Anthropometric Measurements Height: 5' 3 (1.6 m) Weight: (!) 338 lb (153.3 kg) BMI (Calculated): 59.89 Weight at Last Visit: 340lb Weight Lost Since Last Visit: 2lb Weight Gained Since Last Visit: 0 Starting Weight: 338lb Total Weight Loss (lbs): 0 lb (0 kg) Peak Weight: 373lb   Body Composition  Body Fat %: 59.3 % Fat Mass (lbs): 200.6 lbs Muscle Mass (lbs): 130.8 lbs Visceral Fat Rating : 24   Other Clinical Data Fasting: no Labs: no Today's Visit #: 3 Starting Date: 07/11/24    OBESITY Monique Duncan is here to discuss her progress with her obesity treatment plan along with follow-up of her obesity related diagnoses.    Nutrition Plan: the Category 4 plan - 70% adherence.  Current exercise: walking  Interim History:  She is down 2 lbs since her last visit.  Eating all of the food on the plan., Protein intake is as prescribed, and Water intake is inadequate.   Pharmacotherapy: Monique Duncan is on no medications.  Hunger is moderately controlled.  Cravings are moderately controlled.  Assessment/Plan:   Vitamin D  Deficiency Vitamin D  is not at goal of 50.  Most recent vitamin D  level was 7.5. She is on  prescription ergocalciferol  50,000 IU weekly. Lab Results  Component Value Date   VD25OH 7.5 (L) 07/11/2024    Plan: Refill prescription vitamin D  50,000 IU weekly.  Will start using a water App.   Hypoalbuminemia:  She is steadily increasing her protein.   Plan;  Will eat her protein first,  Will have protein at every meal.  Will cook more at home.    Morbid Obesity: Current BMI BMI (Calculated): 59.89   Pharmacotherapy Plan No anti-obesity medications.   Monique Duncan is currently in the action  stage of change. As such, her goal is to continue with weight loss efforts.  She has agreed to the Category 4 plan.  Exercise goals: All adults should avoid inactivity. Some physical activity is better than none, and adults who participate in any amount of physical activity gain some health benefits. She is doing some walking outside of her walk.   Behavioral modification strategies: increasing lean protein intake, decreasing simple carbohydrates , no meal skipping, meal planning , avoiding temptations, keep healthy foods in the home, increase frequency of journaling, weigh protein portions, measure portion sizes, and mindful eating.  Monique Duncan has agreed to follow-up with our clinic in 2 weeks.    Objective:   VITALS: Per patient if applicable, see vitals. GENERAL: Alert and in no acute distress. CARDIOPULMONARY: No increased WOB. Speaking in clear sentences.  PSYCH: Pleasant and cooperative. Speech normal rate and rhythm. Affect is appropriate. Insight and judgement are appropriate. Attention is focused,  linear, and appropriate.  NEURO: Oriented as arrived to appointment on time with no prompting.   Attestation Statements:   This was prepared with the assistance of Engineer, Civil (consulting).  Occasional wrong-word or sound-a-like substitutions may have occurred due to the inherent limitations of voice recognition.  Clayborne Daring, DO   "

## 2024-08-09 ENCOUNTER — Ambulatory Visit (HOSPITAL_COMMUNITY): Admitting: Licensed Clinical Social Worker

## 2024-08-22 ENCOUNTER — Other Ambulatory Visit: Payer: Self-pay | Admitting: Family Medicine

## 2024-08-22 ENCOUNTER — Ambulatory Visit: Admitting: Bariatrics

## 2024-08-22 ENCOUNTER — Other Ambulatory Visit: Payer: Self-pay | Admitting: Urgent Care

## 2024-08-22 ENCOUNTER — Encounter: Payer: Self-pay | Admitting: Bariatrics

## 2024-08-22 VITALS — BP 127/82 | HR 95 | Ht 63.0 in | Wt 344.0 lb

## 2024-08-22 DIAGNOSIS — E559 Vitamin D deficiency, unspecified: Secondary | ICD-10-CM

## 2024-08-22 DIAGNOSIS — Z6841 Body Mass Index (BMI) 40.0 and over, adult: Secondary | ICD-10-CM | POA: Diagnosis not present

## 2024-08-22 DIAGNOSIS — E039 Hypothyroidism, unspecified: Secondary | ICD-10-CM | POA: Diagnosis not present

## 2024-08-22 DIAGNOSIS — M25512 Pain in left shoulder: Secondary | ICD-10-CM

## 2024-08-22 MED ORDER — VITAMIN D (ERGOCALCIFEROL) 1.25 MG (50000 UNIT) PO CAPS: 50000.0000 [IU] | ORAL_CAPSULE | ORAL | 0 refills | Status: AC

## 2024-08-22 NOTE — Progress Notes (Signed)
 "                                                                                                             WEIGHT SUMMARY AND BIOMETRICS  Weight Lost Since Last Visit: 0  Weight Gained Since Last Visit: 6lb   Vitals BP: 127/82 Pulse Rate: 95 SpO2: 98 %   Anthropometric Measurements Height: 5' 3 (1.6 m) Weight: (!) 344 lb (156 kg) BMI (Calculated): 60.95 Weight at Last Visit: 338lb Weight Lost Since Last Visit: 0 Weight Gained Since Last Visit: 6lb Starting Weight: 338lb Total Weight Loss (lbs): 0 lb (0 kg) Peak Weight: 373lb   Body Composition  Body Fat %: 60.9 % Fat Mass (lbs): 209.8 lbs Muscle Mass (lbs): 128 lbs Visceral Fat Rating : 25   Other Clinical Data Fasting: no Labs: no Today's Visit #: 4 Starting Date: 07/11/24    OBESITY Monique Duncan is here to discuss her progress with her obesity treatment plan along with follow-up of her obesity related diagnoses.    Nutrition Plan: the Category 4 plan - 75% adherence.  Current exercise: chair exercises  Interim History:  She is up 6 lbs since her last visit. She states I have been working so hard. According to the bio-impedence scale she is up 1.6 in body fat %. Her visceral fat rating is up 1 point.  Eating all of the food on the plan., Protein intake is as prescribed, and Water intake is adequate.   Pharmacotherapy: Jasemine is on no anti-obesity medications.  Hunger is moderately controlled.  Cravings are moderately controlled.  Assessment/Plan:   Vitamin D  Deficiency Vitamin D  is not at goal of 50.  Most recent vitamin D  level was 7.5. She is on  prescription ergocalciferol  50,000 IU weekly. Lab Results  Component Value Date   VD25OH 7.5 (L) 07/11/2024    Plan: Refill prescription vitamin D  50,000 IU weekly.  Will eat more vitamin D  rich-foods.   Hypothyroidism Stable.  Does not report symptoms associated with uncontrolled hypothyroidism. Medication(s): Levothyroxine  100 mcg daily. Lab  Results  Component Value Date   TSH 8.800 (H) 07/11/2024    Plan: Continue levothyroxine  at current dose. Counseling: The correct way to take levothyroxine  is fasting, with water, separated by at least 30 minutes from breakfast, and separated by more than 4 hours from calcium, iron, multivitamins, acid reflux medications (PPIs).  Will increase her exercise: chair exercises and chair dance.  Will increase her water to 64 ounces daily.  Will journal on a regular basis.    Morbid Obesity: Current BMI BMI (Calculated): 60.95   Pharmacotherapy Plan No anti-obesity medications.   Amiel is currently in the action stage of change. As such, her goal is to continue with weight loss efforts.  She has agreed to the Category 4 plan and keeping a food journal with goal of 1,700 to 1,800  calories and 100 to 120 grams of protein daily.  Exercise goals: All adults should avoid inactivity. Some physical activity is better than none, and adults who participate in  any amount of physical activity gain some health benefits.  Behavioral modification strategies: increasing lean protein intake, decreasing simple carbohydrates , no meal skipping, meal planning , better snacking choices, planning for success, increasing fiber rich foods, decrease junk food, get rid of junk food in the home, avoiding temptations, keep healthy foods in the home, weigh protein portions, measure portion sizes, work on smaller portions, mindful eating, and eat on smaller plate.  Roderick has agreed to follow-up with our clinic in 2 weeks.       Objective:   VITALS: Per patient if applicable, see vitals. GENERAL: Alert and in no acute distress. CARDIOPULMONARY: No increased WOB. Speaking in clear sentences.  PSYCH: Pleasant and cooperative. Speech normal rate and rhythm. Affect is appropriate. Insight and judgement are appropriate. Attention is focused, linear, and appropriate.  NEURO: Oriented as arrived to appointment on time  with no prompting.   Attestation Statements:   This was prepared with the assistance of Engineer, Civil (consulting).  Occasional wrong-word or sound-a-like substitutions may have occurred due to the inherent limitations of voice recognition.   Clayborne Daring, DO    "

## 2024-08-25 ENCOUNTER — Ambulatory Visit (HOSPITAL_COMMUNITY): Admitting: Licensed Clinical Social Worker

## 2024-09-05 ENCOUNTER — Ambulatory Visit: Admitting: Bariatrics

## 2024-09-05 ENCOUNTER — Ambulatory Visit (HOSPITAL_COMMUNITY): Admitting: Licensed Clinical Social Worker

## 2024-09-20 ENCOUNTER — Ambulatory Visit: Payer: Self-pay | Admitting: Family Medicine
